# Patient Record
Sex: Male | Born: 1965 | Race: White | Hispanic: No | Marital: Single | Smoking: Never smoker
Health system: Southern US, Community
[De-identification: ages and names within clinical notes are randomized; demographics above are authoritative.]

---

## 2007-01-03 ENCOUNTER — Emergency Department (HOSPITAL_COMMUNITY): Admission: EM | Admit: 2007-01-03 | Discharge: 2007-01-03 | Payer: Self-pay | Admitting: Emergency Medicine

## 2019-10-20 DIAGNOSIS — S42302A Unspecified fracture of shaft of humerus, left arm, initial encounter for closed fracture: Secondary | ICD-10-CM

## 2019-10-20 HISTORY — DX: Unspecified fracture of shaft of humerus, left arm, initial encounter for closed fracture: S42.302A

## 2019-11-13 ENCOUNTER — Encounter: Payer: Self-pay | Admitting: Emergency Medicine

## 2019-11-13 ENCOUNTER — Other Ambulatory Visit: Payer: Self-pay

## 2019-11-13 ENCOUNTER — Emergency Department: Payer: BC Managed Care – PPO

## 2019-11-13 ENCOUNTER — Emergency Department
Admission: EM | Admit: 2019-11-13 | Discharge: 2019-11-13 | Disposition: A | Discharge status: Home / Self Care | Payer: BC Managed Care – PPO | Attending: Student in an Organized Health Care Education/Training Program | Admitting: Student in an Organized Health Care Education/Training Program

## 2019-11-13 DIAGNOSIS — Y998 Other external cause status: Secondary | ICD-10-CM | POA: Insufficient documentation

## 2019-11-13 DIAGNOSIS — Y9311 Activity, swimming: Secondary | ICD-10-CM | POA: Diagnosis not present

## 2019-11-13 DIAGNOSIS — W01198A Fall on same level from slipping, tripping and stumbling with subsequent striking against other object, initial encounter: Secondary | ICD-10-CM | POA: Insufficient documentation

## 2019-11-13 DIAGNOSIS — S4992XA Unspecified injury of left shoulder and upper arm, initial encounter: Secondary | ICD-10-CM | POA: Diagnosis present

## 2019-11-13 DIAGNOSIS — S42352A Displaced comminuted fracture of shaft of humerus, left arm, initial encounter for closed fracture: Secondary | ICD-10-CM | POA: Diagnosis not present

## 2019-11-13 DIAGNOSIS — Y9234 Swimming pool (public) as the place of occurrence of the external cause: Secondary | ICD-10-CM | POA: Insufficient documentation

## 2019-11-13 DIAGNOSIS — F1410 Cocaine abuse, uncomplicated: Secondary | ICD-10-CM | POA: Diagnosis not present

## 2019-11-13 MED ORDER — OXYCODONE-ACETAMINOPHEN 5-325 MG PO TABS
1.0000 | ORAL_TABLET | Freq: Once | ORAL | Status: AC
  Administered 2019-11-13: 1 via ORAL
  Filled 2019-11-13: qty 1

## 2019-11-13 MED ORDER — OXYCODONE-ACETAMINOPHEN 5-325 MG PO TABS: 1.0000 | ORAL_TABLET | Freq: Four times a day (QID) | ORAL | 0 refills | Status: DC | PRN

## 2019-11-13 NOTE — ED Triage Notes (Signed)
Patient states that he slipped and fell by the pool. Patient with pain and swelling to left upper arm.
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
## 2019-11-14 NOTE — TOC Progression Note (Signed)
Transition of Care [facility]) - Progression Note    Patient Details  Name: Cullan Launer MRN: [id number] Date of Birth: [birth year]  Transition of [facility]) CM/SW Contact  Barrie Dunker, RN Phone Number: 11/14/2019, 8:27 AM  Clinical Narrative:   Emailed a referral to [facility]          Expected Discharge Plan and Services                                                 Social Determinants of Health (SDOH) Interventions    Readmission Risk Interventions No flowsheet data found.

## 2019-11-18 HISTORY — PX: ORIF HUMERAL SHAFT FRACTURE: SUR937

## 2019-12-21 HISTORY — PX: INCISION AND DRAINAGE: SHX5863

## 2020-01-04 ENCOUNTER — Telehealth: Payer: Self-pay

## 2020-01-04 NOTE — Telephone Encounter (Signed)
COVID-19 Pre-Screening Questions:01/04/20 ° °Do you currently have a fever (>100 °F), chills or unexplained body aches? NO ° °Are you currently experiencing new cough, shortness of breath, sore throat, runny nose? NO °•  °Have you been in contact with someone that is currently pending confirmation of Covid19 testing or has been confirmed to have the Covid19 virus? NO ° °**If the patient answers NO to ALL questions -  advise the patient to please call the clinic before coming to the office should any symptoms develop.  ° ° °

## 2020-01-05 ENCOUNTER — Other Ambulatory Visit: Payer: Self-pay

## 2020-01-05 ENCOUNTER — Ambulatory Visit (INDEPENDENT_AMBULATORY_CARE_PROVIDER_SITE_OTHER): Payer: BC Managed Care – PPO | Admitting: Internal Medicine

## 2020-01-05 ENCOUNTER — Encounter: Payer: Self-pay | Admitting: Internal Medicine

## 2020-01-05 VITALS — Ht 73.0 in | Wt 201.0 lb

## 2020-01-05 DIAGNOSIS — S42302A Unspecified fracture of shaft of humerus, left arm, initial encounter for closed fracture: Secondary | ICD-10-CM

## 2020-01-05 DIAGNOSIS — T847XXA Infection and inflammatory reaction due to other internal orthopedic prosthetic devices, implants and grafts, initial encounter: Secondary | ICD-10-CM | POA: Diagnosis not present

## 2020-01-05 DIAGNOSIS — Z5181 Encounter for therapeutic drug level monitoring: Secondary | ICD-10-CM | POA: Diagnosis not present
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
## 2020-01-05 NOTE — Patient Instructions (Signed)
Thank you for coming to see me today. It was a pleasure.   Today we talked about:  -- we are continuing your antibiotics of vancomycin and ceftriaxone at the current dose.  I'll talk to your surgeon regarding a plan for discontinuing and we will come up with a plan.   Please follow-up with me in about 3-4 weeks.  If you have any questions or concerns, please do not hesitate to call the office at [phone].  Take Care,   Gwynn Burly, DO
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
## 2020-01-06 ENCOUNTER — Telehealth: Payer: Self-pay

## 2020-01-06 NOTE — Telephone Encounter (Signed)
[facility] returning [facility] phone call. Wanted to make her aware that picc line was initially out 4-5cm and this was not new development. Routing to [place] to make her aware. Valarie Cones

## 2020-01-06 NOTE — Progress Notes (Signed)
Patient uses [facility] for home infusion pharmacy and home health [phone]).  Per Olena Leatherwood, pharmacist, patient currently on ceftriaxone 2 gm daily, vancomycin 1750mg   Q12 (dosed at 8am and 8pm) with end date of 01/06/20 per his surgeon.   Relayed verbal order to [facility], pharmacist at [facility], per Dr [facility] to extend IV antibiotics for a full 6 week treatment, end date of 02/03/20, with twice weekly Vanc trough and CMP.  Labs should be faxed to [phone]. Next lab date is Tuesday 01/10/20. 01/12/20, RN

## 2020-01-06 NOTE — Telephone Encounter (Signed)
Thank you.  PICC was out 5cm at office visit, matching their report. Andree Coss, RN

## 2020-01-10 ENCOUNTER — Telehealth: Payer: Self-pay

## 2020-01-10 NOTE — Telephone Encounter (Signed)
Yes, I would recommend increasing dose. The pharmacist taking care of this should do this. We are not dosing. Thank you!

## 2020-01-10 NOTE — Telephone Encounter (Signed)
Pharmacist with Home health pharmacy called to notify team that vanc trough was low at 9.8; suggesting increase in vanc dose. Forwarding to provider and pharmacist. Lab work faxed to office this am.  Rosanna Randy, RN

## 2020-01-10 NOTE — Telephone Encounter (Signed)
Returned call to pharmacist with Home health team. Will fax new orders for review/provider signature with changes.   Leeba Barbe Loyola Mast, RN

## 2020-01-17 ENCOUNTER — Telehealth: Payer: Self-pay

## 2020-01-17 NOTE — Telephone Encounter (Signed)
Received call today from pharmacist with [facility] stating patient is Neutropenic. States absolute neutro count is 1.1, WBC: 2.8, Vanc trough and CMP pending.  Patient is currently on Vancomycin and Rocephin. Pharmacy would like call back with orders. P: [phone] Lorenso Courier, CMA

## 2020-01-17 NOTE — Telephone Encounter (Addendum)
Discussed with Dr. Juleen China. Called [facility] and spoke to pharmacist, [place]. Gave verbal orders to stop vancomycin and start daptomycin 8 mg/kg - 730 mg IV once daily. Also gave orders to continue ceftriaxone and lab orders but to d/c vanc troughs and add weekly CPK. This will be patient's first dose, so a nurse will go out to the patient's home with an anaphylaxis kit tomorrow morning to ensure patient tolerates dose. Sandy verbalized understanding. She will fax orders here for Dr. Juleen China to sign for their records.

## 2020-01-17 NOTE — Telephone Encounter (Signed)
Received call from wife, patient is currently at surgeon's office. Wife stating Dr. Stephenie Acres is requesting patient to be seen today or tomorrow regarding increased wound drainage. Wife  Gave phone to RN. She denies any wound odor, denies fevers. Made nurse aware that [facility] does not have any available opening today. Wife accepts sooner appointment on 01/23/20. Per nurse Dr. Stephenie Acres has Dr. Philis Pique cell phone number and will consult with Dr. Earlene Plater. Routing message to make Dr. Earlene Plater aware.  Valarie Cones

## 2020-01-18 ENCOUNTER — Telehealth: Payer: Self-pay | Admitting: Internal Medicine

## 2020-01-18 NOTE — Telephone Encounter (Signed)
Patient called this morning to update on [facility] labs and discuss changing vancomycin to daptomycin while also continuing ceftriaxone.  Pt reports home health RN had just been at the house to administer first dose and it went fine.  He reports some increased drainage from wound that his surgeon is aware of and is awaiting CT scan order for further evaluation.  Follow up with me confirmed for 01/23/20.  Pt otherwise states doing well without fevers, chills, worsening pain.    [facility] 01/18/2020, 11:28 AM

## 2020-01-20 ENCOUNTER — Other Ambulatory Visit: Payer: Self-pay

## 2020-01-20 ENCOUNTER — Other Ambulatory Visit: Payer: Self-pay | Admitting: Orthopaedic Surgery

## 2020-01-20 ENCOUNTER — Ambulatory Visit
Admission: RE | Admit: 2020-01-20 | Discharge: 2020-01-20 | Disposition: A | Discharge status: Home / Self Care | Payer: BC Managed Care – PPO | Source: Ambulatory Visit | Attending: Orthopaedic Surgery | Admitting: Orthopaedic Surgery

## 2020-01-20 DIAGNOSIS — G8918 Other acute postprocedural pain: Secondary | ICD-10-CM

## 2020-01-20 DIAGNOSIS — S42332D Displaced oblique fracture of shaft of humerus, left arm, subsequent encounter for fracture with routine healing: Secondary | ICD-10-CM

## 2020-01-23 ENCOUNTER — Encounter: Payer: Self-pay | Admitting: Internal Medicine

## 2020-01-23 ENCOUNTER — Other Ambulatory Visit: Payer: Self-pay

## 2020-01-23 ENCOUNTER — Ambulatory Visit (INDEPENDENT_AMBULATORY_CARE_PROVIDER_SITE_OTHER): Payer: BC Managed Care – PPO | Admitting: Internal Medicine

## 2020-01-23 DIAGNOSIS — S42302A Unspecified fracture of shaft of humerus, left arm, initial encounter for closed fracture: Secondary | ICD-10-CM

## 2020-01-23 DIAGNOSIS — T847XXA Infection and inflammatory reaction due to other internal orthopedic prosthetic devices, implants and grafts, initial encounter: Secondary | ICD-10-CM | POA: Diagnosis not present

## 2020-01-23 NOTE — Progress Notes (Addendum)
[facility]  Chief Complaint:  Follow up of wound infection  Subjective: Danny Roberts is a 54 y.o. male with PMHx as below who presents to the clinic for follow up of his wound infection. Please see A&P for the details of today's visit and status of the patient's medical problems.   Past Medical History:  Diagnosis Date   Fracture of humeral shaft, left, closed 10/2019    Outpatient Encounter Medications as of 01/23/2020  Medication Sig   diclofenac (CATAFLAM) 50 MG tablet diclofenac potassium 50 mg tablet  Take 1 tablet 3 times a day by oral route.  with food   No facility-administered encounter medications on file as of 01/23/2020.    Family History  Problem Relation Age of Onset   Heart disease Father    Diabetes Brother    Heart disease Brother    Tuberculosis Neg Hx     Social History   Socioeconomic History   Marital status: Single    Spouse name: Not on file   Number of children: Not on file   Years of education: Not on file   Highest education level: Not on file  Occupational History   Not on file  Tobacco Use   Smoking status: Never Smoker   Smokeless tobacco: Never Used  Substance and Sexual Activity   Alcohol use: Yes   Drug use: Not Currently    Types: Cocaine   Sexual activity: Not on file  Other Topics Concern   Not on file  Social History Narrative   Not on file   Social Determinants of Health   Financial Resource Strain:    Difficulty of Paying Living Expenses: Not on file  Food Insecurity:    Worried About Running Out of Food in the Last Year: Not on file   Ran Out of Food in the Last Year: Not on file  Transportation Needs:    Lack of Transportation (Medical): Not on file   Lack of Transportation (Non-Medical): Not on file  Physical Activity:    Days of Exercise per Week: Not on file   Minutes of Exercise per Session: Not on file  Stress:    Feeling of Stress : Not  on file  Social Connections:    Frequency of Communication with Friends and Family: Not on file   Frequency of Social Gatherings with Friends and Family: Not on file   Attends Religious Services: Not on file   Active Member of Clubs or Organizations: Not on file   Attends Banker Meetings: Not on file   Marital Status: Not on file  Intimate Partner Violence:    Fear of Current or Ex-Partner: Not on file   Emotionally Abused: Not on file   Physically Abused: Not on file   Sexually Abused: Not on file    No Known Allergies   Review of Systems: Review of Systems  Constitutional: Negative for chills and fever.  Respiratory: Negative.   Cardiovascular: Negative.   Gastrointestinal: Negative.   Musculoskeletal: Positive for joint pain.  Skin: Negative.      All others negative.  Objective: Vitals:   01/23/20 1123  Pulse: 74  Temp: 97.8 F (36.6 C)  SpO2: 100%  Weight: 201 lb (91.2 kg)   Body mass index is 26.52 kg/m.  Physical Exam Constitutional:      General: He is not in acute distress.    Appearance: Normal appearance.  HENT:  Head: Normocephalic and atraumatic.  Musculoskeletal:        General: Deformity and signs of injury present. No tenderness.     Comments: See picture.  He was able to express some sanguinous fluid from the upper   Skin:    General: Skin is warm and dry.  Neurological:     Mental Status: He is alert.        Imaging: IMPRESSION: Status post fixation of a diaphyseal fracture of the left humerus. There is callus formation but no bridging bone is identified.  Evaluation for osteomyelitis is limited on CT scan, particularly in the setting of recent fracture fixation, but no convincing evidence of osteomyelitis is seen.  Stranding in the posterior subcutaneous tissues of the upper arm could be due to cellulitis and/or postoperative change. No abscess is identified.    Assessment and  Plan:  Hardware complicating wound infection (HCC) Status post left humeral shaft fracture with ORIF 11/18/2019 complicated by wound infection now status post irrigation and debridement of wound and surgical site on 12/21/2019.  I saw him on 01/05/20 and he was doing well post-operatively with vancomycin and ceftriaxone.  His cultures were negative and abx were continued.  I discussed with his surgeon, Dr Stephenie Acres, who noted that infection did track to the hardware but that muscle and fascia were intact.   My intention is to treat as an osteofixation infection for 6 weeks of IV antibiotics.  Last week his WBC dropped and this was attributed to vancomycin.  He was switched to daptomycin and continued on ceftriaxone.  He saw his surgeon last week who was concerned that he may need another debridement based on increased drainage.  A CT was obtained to evaluate fracture healing.  The images are not available but the report noted that there was callus formation but no bridging bone identified.  No evidence of OM was seen.  There was some stranding in the posterior subcutaneous tissue that could be cellulitis and/or postoperative change.  No abscess was seen.   Plan: --Continue ceftriaxone and daptomycin for now with weekly lab monitoring --Will discuss with surgeon regarding CT findings --RTC 02/08/20     Vedia Coffer for Infectious Disease [facility] 01/31/2020, 11:11 AM

## 2020-01-23 NOTE — Assessment & Plan Note (Addendum)
Status post left humeral shaft fracture with ORIF 11/18/2019 complicated by wound infection now status post irrigation and debridement of wound and surgical site on 12/21/2019.  I saw him on 01/05/20 and he was doing well post-operatively with vancomycin and ceftriaxone.  His cultures were negative and abx were continued.  I discussed with his surgeon, Dr Stephenie Acres, who noted that infection did track to the hardware but that muscle and fascia were intact.   My intention is to treat as an osteofixation infection for 6 weeks of IV antibiotics.  Last week his WBC dropped and this was attributed to vancomycin.  He was switched to daptomycin and continued on ceftriaxone.  He saw his surgeon last week who was concerned that he may need another debridement based on increased drainage.  A CT was obtained to evaluate fracture healing.  The images are not available but the report noted that there was callus formation but no bridging bone identified.  No evidence of OM was seen.  There was some stranding in the posterior subcutaneous tissue that could be cellulitis and/or postoperative change.  No abscess was seen.   Plan: --Continue ceftriaxone and daptomycin for now with weekly lab monitoring --Will discuss with surgeon regarding CT findings --RTC 02/08/20

## 2020-01-23 NOTE — Patient Instructions (Signed)
Thank you for coming to see me today. It was a pleasure.   Today we talked about:  -- I will touch base with Dr Stephenie Acres about next steps    Please follow-up with on October 20 in a couple weeks.  If you have any questions or concerns, please do not hesitate to call the office at [phone].  Take Care,   Gwynn Burly, DO

## 2020-01-25 ENCOUNTER — Ambulatory Visit: Payer: BC Managed Care – PPO | Admitting: Internal Medicine

## 2020-01-27 ENCOUNTER — Telehealth: Payer: Self-pay | Admitting: *Deleted

## 2020-01-27 NOTE — Telephone Encounter (Signed)
Per Dr Juleen China, requested last 2 weeks of labs to be faxed to [phone]; ESR, CRP added for Monday's lab draw. Landis Gandy, RN

## 2020-02-01 ENCOUNTER — Telehealth: Payer: Self-pay | Admitting: *Deleted

## 2020-02-01 NOTE — Telephone Encounter (Signed)
Per verbal order from Dr Earlene Plater, called outpatient pharmacy with new end date of 02/10/20 for IV antibiotics, labs, PICC care. Patient follows up in clinic 10/20. Andree Coss, RN

## 2020-02-08 ENCOUNTER — Other Ambulatory Visit: Payer: Self-pay

## 2020-02-08 ENCOUNTER — Ambulatory Visit (INDEPENDENT_AMBULATORY_CARE_PROVIDER_SITE_OTHER): Payer: BC Managed Care – PPO | Admitting: Internal Medicine

## 2020-02-08 ENCOUNTER — Encounter: Payer: Self-pay | Admitting: Internal Medicine

## 2020-02-08 ENCOUNTER — Telehealth: Payer: Self-pay

## 2020-02-08 VITALS — BP 136/101 | HR 78 | Temp 97.7°F | Ht 73.0 in | Wt 200.0 lb

## 2020-02-08 DIAGNOSIS — T847XXA Infection and inflammatory reaction due to other internal orthopedic prosthetic devices, implants and grafts, initial encounter: Secondary | ICD-10-CM

## 2020-02-08 MED ORDER — SULFAMETHOXAZOLE-TRIMETHOPRIM 800-160 MG PO TABS: 1.0000 | ORAL_TABLET | Freq: Two times a day (BID) | ORAL | 0 refills | Status: DC

## 2020-02-08 NOTE — Assessment & Plan Note (Addendum)
Status post left humeral shaft fracture with ORIF 07/10/2246 complicated by wound infection now status post irrigation and debridement of wound and surgical site on 12/21/2019.    Patient was seen by his orthopedic surgeon on 10/12.  His wound looked better that visit and seemed to have been responding well to the daptomycin and ceftriaxone combination.  He has now been on antibiotics intravenously since his surgery on 12/21/19.  I discussed with his surgeon who sees him back next week for re-check.  Plan is to repeat x-ray soon and then repeat CT 6 weeks from prior scan (~03/02/20).  Vanco 9/1 --> 9/28 Dapto 9/29 --> pres CTX 9/1 --> pres  Plan: -- continue IV antibiotics through end date of 02/10/20 which will have been 7 weeks of IV antibiotics.  Pull PICC at end of treatment -- at that point will transition him to an oral suppressive regimen until he has repeat CT scan in a few weeks.  At that point if there is non-union then this could be a sign of ongoing infection and need for plate removal/re-debridement. -- will prescribe TMP-SMX 1 DS tablet BID for suppression -- repeat CBC, BMP, ESR/CRP today -- RTC 2 weeks

## 2020-02-08 NOTE — Progress Notes (Addendum)
[facility]  Chief Complaint:  Follow up for hardware complicating wound infection  Subjective: Danny Roberts is a 54 y.o. male with PMHx as below who presents to the clinic for follow of hardware complicating wound infection. Please see A&P for the details of today's visit and status of the patient's medical problems.   Past Medical History:  Diagnosis Date  . Fracture of humeral shaft, left, closed 10/2019    Outpatient Encounter Medications as of 02/08/2020  Medication Sig  . cyclobenzaprine (FLEXERIL) 10 MG tablet Take 10 mg by mouth 3 (three) times daily.  . diclofenac (CATAFLAM) 50 MG tablet diclofenac potassium 50 mg tablet  Take 1 tablet 3 times a day by oral route.  with food  . diclofenac Sodium (VOLTAREN) 1 % GEL Apply 1 application topically 4 (four) times daily.  Marland Kitchen sulfamethoxazole-trimethoprim (BACTRIM DS) 800-160 MG tablet Take 1 tablet by mouth 2 (two) times daily.   No facility-administered encounter medications on file as of 02/08/2020.    Family History  Problem Relation Age of Onset  . Heart disease Father   . Diabetes Brother   . Heart disease Brother   . Tuberculosis Neg Hx     Social History   Socioeconomic History  . Marital status: Single    Spouse name: Not on file  . Number of children: Not on file  . Years of education: Not on file  . Highest education level: Not on file  Occupational History  . Not on file  Tobacco Use  . Smoking status: Never Smoker  . Smokeless tobacco: Never Used  Substance and Sexual Activity  . Alcohol use: Yes    Comment: occ beer  . Drug use: Not Currently    Types: Cocaine  . Sexual activity: Not on file  Other Topics Concern  . Not on file  Social History Narrative  . Not on file   Social Determinants of Health   Financial Resource Strain:   . Difficulty of Paying Living Expenses: Not on file  Food Insecurity:   . Worried About Charity fundraiser in the Last  Year: Not on file  . Ran Out of Food in the Last Year: Not on file  Transportation Needs:   . Lack of Transportation (Medical): Not on file  . Lack of Transportation (Non-Medical): Not on file  Physical Activity:   . Days of Exercise per Week: Not on file  . Minutes of Exercise per Session: Not on file  Stress:   . Feeling of Stress : Not on file  Social Connections:   . Frequency of Communication with Friends and Family: Not on file  . Frequency of Social Gatherings with Friends and Family: Not on file  . Attends Religious Services: Not on file  . Active Member of Clubs or Organizations: Not on file  . Attends Archivist Meetings: Not on file  . Marital Status: Not on file  Intimate Partner Violence:   . Fear of Current or Ex-Partner: Not on file  . Emotionally Abused: Not on file  . Physically Abused: Not on file  . Sexually Abused: Not on file    No Known Allergies   Review of Systems: Review of Systems  Constitutional: Negative for chills and fever.  Gastrointestinal: Negative for diarrhea.  Musculoskeletal:       Improved swelling and drainage.      All others negative.  Objective: Vitals:   02/08/20 1039  BP: (!) 136/101  Pulse: 78  Temp: 97.7 F (36.5 C)  TempSrc: Oral  SpO2: 99%  Weight: 200 lb (90.7 kg)  Height: _0  (1.854 m)   Body mass index is 26.39 kg/m.  Physical Exam Vitals reviewed.  Constitutional:      General: He is not in acute distress.    Appearance: Normal appearance.  Pulmonary:     Effort: Pulmonary effort is normal. No respiratory distress.  Musculoskeletal:     Comments: See picture in chart.   Neurological:     Mental Status: He is alert.        Assessment and Plan:  Hardware complicating wound infection ([place]) Status post left humeral shaft fracture with ORIF 3/53/2992 complicated by wound infection now status post irrigation and debridement of wound and surgical site on 12/21/2019.    Patient was seen by  his orthopedic surgeon on 10/12.  His wound looked better that visit and seemed to have been responding well to the daptomycin and ceftriaxone combination.  He has now been on antibiotics intravenously since his surgery on 12/21/19.  I discussed with his surgeon who sees him back next week for re-check.  Plan is to repeat x-ray soon and then repeat CT 6 weeks from prior scan (~03/02/20).  Vanco 9/1 --> 9/28 Dapto 9/29 --> pres CTX 9/1 --> pres  Plan: -- continue IV antibiotics through end date of 02/10/20 which will have been 7 weeks of IV antibiotics.  Pull PICC at end of treatment -- at that point will transition him to an oral suppressive regimen until he has repeat CT scan in a few weeks.  At that point if there is non-union then this could be a sign of ongoing infection and need for plate removal/re-debridement. -- will prescribe TMP-SMX 1 DS tablet BID for suppression -- repeat CBC, BMP, ESR/CRP today -- RTC 2 weeks    Orders Placed This Encounter  Procedures  . CBC  . BASIC METABOLIC PANEL WITH GFR  . Sedimentation rate  . C-reactive protein     I spent 52 minutes with the patient including greater than 50% of time in face to face counsel of the patient and in coordination of their care.   Raynelle Highland for Infectious Disease [facility] 02/08/2020, 11:14 AM

## 2020-02-08 NOTE — Patient Instructions (Signed)
Thank you for coming to see me today. It was a pleasure.   Today we talked about:  -- we'll finish the IV antibiotics this Friday which will be a little over 7 weeks of treatment -- we then will start an oral suppressive antibiotic called Bactrim for a few weeks at least until you have your repeat CT scan -- I will check labs today and plan to see you back in about 2 weeks  If you have any questions or concerns, please do not hesitate to call the office at [phone].  Take Care,   Gwynn Burly, DO

## 2020-02-08 NOTE — Telephone Encounter (Signed)
Spoke with Olena Leatherwood at [facility] to relay verbal orders per Dr. Earlene Plater that okay to pull PICC after last antibiotic infusion on 02/10/20. Orders repeated and verified.   Sandie Ano, RN

## 2020-02-09 ENCOUNTER — Telehealth: Payer: Self-pay

## 2020-02-09 LAB — CBC
HCT: 44.5 % (ref 38.5–50.0)
Hemoglobin: 14.8 g/dL (ref 13.2–17.1)
MCH: 30 pg (ref 27.0–33.0)
MCHC: 33.3 g/dL (ref 32.0–36.0)
MCV: 90.3 fL (ref 80.0–100.0)
MPV: 10.9 fL (ref 7.5–12.5)
Platelets: 226 10*3/uL (ref 140–400)
RBC: 4.93 10*6/uL (ref 4.20–5.80)
RDW: 13.4 % (ref 11.0–15.0)
WBC: 4.5 10*3/uL (ref 3.8–10.8)

## 2020-02-09 LAB — BASIC METABOLIC PANEL WITH GFR
BUN: 22 mg/dL (ref 7–25)
CO2: 27 mmol/L (ref 20–32)
Calcium: 9.5 mg/dL (ref 8.6–10.3)
Chloride: 103 mmol/L (ref 98–110)
Creat: 0.85 mg/dL (ref 0.70–1.33)
GFR, Est African American: 114 mL/min/{1.73_m2} (ref >=60)
GFR, Est Non African American: 99 mL/min/{1.73_m2} (ref >=60)
Glucose, Bld: 87 mg/dL (ref 65–99)
Potassium: 4.4 mmol/L (ref 3.5–5.3)
Sodium: 137 mmol/L (ref 135–146)

## 2020-02-09 LAB — C-REACTIVE PROTEIN: CRP: 9.1 mg/L — ABNORMAL HIGH (ref <8.0)

## 2020-02-09 LAB — SEDIMENTATION RATE: Sed Rate: 17 mm/h (ref 0–20)

## 2020-02-09 NOTE — Telephone Encounter (Signed)
Left VM requesting call back to review lab results and treatment plan.  Alaiah Lundy Loyola Mast, RN

## 2020-02-09 NOTE — Telephone Encounter (Signed)
-----   Message from Kathlynn Grate, DO sent at 02/09/2020  1:55 PM EDT ----- Can you let pt know his labs from yesterday look reassuring.  Continue current plan of IV antibiotics through Friday then start Bactrim as prescribed.  I will see him back in 2 weeks. Thanks.

## 2020-02-09 NOTE — Telephone Encounter (Signed)
Patient returned the call. RN relayed results. Patient verbalized understanding. Andree Coss, RN

## 2020-02-16 ENCOUNTER — Other Ambulatory Visit: Payer: Self-pay | Admitting: Orthopaedic Surgery

## 2020-02-16 ENCOUNTER — Other Ambulatory Visit (HOSPITAL_COMMUNITY): Payer: Self-pay | Admitting: Orthopaedic Surgery

## 2020-02-16 DIAGNOSIS — Z9889 Other specified postprocedural states: Secondary | ICD-10-CM

## 2020-02-22 ENCOUNTER — Ambulatory Visit
Admission: RE | Admit: 2020-02-22 | Discharge: 2020-02-22 | Disposition: A | Discharge status: Home / Self Care | Payer: BC Managed Care – PPO | Source: Ambulatory Visit | Attending: Orthopaedic Surgery | Admitting: Orthopaedic Surgery

## 2020-02-22 ENCOUNTER — Other Ambulatory Visit: Payer: Self-pay

## 2020-02-22 DIAGNOSIS — Z9889 Other specified postprocedural states: Secondary | ICD-10-CM | POA: Diagnosis not present

## 2020-02-23 NOTE — Progress Notes (Signed)
[facility]  Chief Complaint:  Follow up for hardware complicating wound infection  Subjective: Danny Roberts is a 54 y.o. male with PMHx as below who presents to the clinic for follow up hardware complicating wound infection.  Last seen in clinic on 02/08/20. Please see A&P for the details of today's visit and status of the patient's medical problems.   Past Medical History:  Diagnosis Date  . Fracture of humeral shaft, left, closed 10/2019    Outpatient Encounter Medications as of 02/24/2020  Medication Sig  . cyclobenzaprine (FLEXERIL) 10 MG tablet Take 10 mg by mouth 3 (three) times daily.  . diclofenac (CATAFLAM) 50 MG tablet diclofenac potassium 50 mg tablet  Take 1 tablet 3 times a day by oral route.  with food  . sulfamethoxazole-trimethoprim (BACTRIM DS) 800-160 MG tablet Take 1 tablet by mouth 2 (two) times daily.  . [DISCONTINUED] diclofenac Sodium (VOLTAREN) 1 % GEL Apply 1 application topically 4 (four) times daily.   No facility-administered encounter medications on file as of 02/24/2020.    Family History  Problem Relation Age of Onset  . Heart disease Father   . Diabetes Brother   . Heart disease Brother   . Tuberculosis Neg Hx     Social History   Socioeconomic History  . Marital status: Single    Spouse name: Not on file  . Number of children: Not on file  . Years of education: Not on file  . Highest education level: Not on file  Occupational History  . Not on file  Tobacco Use  . Smoking status: Never Smoker  . Smokeless tobacco: Never Used  Substance and Sexual Activity  . Alcohol use: Yes    Comment: occ beer  . Drug use: Not Currently    Types: Cocaine  . Sexual activity: Not on file  Other Topics Concern  . Not on file  Social History Narrative  . Not on file   Social Determinants of Health   Financial Resource Strain:   . Difficulty of Paying Living Expenses: Not on file  Food Insecurity:     . Worried About Charity fundraiser in the Last Year: Not on file  . Ran Out of Food in the Last Year: Not on file  Transportation Needs:   . Lack of Transportation (Medical): Not on file  . Lack of Transportation (Non-Medical): Not on file  Physical Activity:   . Days of Exercise per Week: Not on file  . Minutes of Exercise per Session: Not on file  Stress:   . Feeling of Stress : Not on file  Social Connections:   . Frequency of Communication with Friends and Family: Not on file  . Frequency of Social Gatherings with Friends and Family: Not on file  . Attends Religious Services: Not on file  . Active Member of Clubs or Organizations: Not on file  . Attends Archivist Meetings: Not on file  . Marital Status: Not on file  Intimate Partner Violence:   . Fear of Current or Ex-Partner: Not on file  . Emotionally Abused: Not on file  . Physically Abused: Not on file  . Sexually Abused: Not on file    No Known Allergies   Review of Systems: Review of Systems  Constitutional: Negative for chills and fever.  Musculoskeletal: Negative for joint pain.       Continued drainage  Skin: Negative for itching and rash.  Objective: Vitals:   02/24/20 1037  BP: 125/71  Pulse: 76  Temp: 97.6 F (36.4 C)  Weight: 207 lb (93.9 kg)   Body mass index is 27.31 kg/m.  Physical Exam Constitutional:      General: He is not in acute distress.    Appearance: Normal appearance.  HENT:     Head: Normocephalic and atraumatic.  Musculoskeletal:     Comments: Right picc line now removed. Left arm with mild drainage of serosanguinous fluid able to be expressed from distal area of wound.  Has good ROM with elbow flexion/extension.  Neurological:     Mental Status: He is alert.       Pertinent Labs and Microbiology CBC Latest Ref Rng & Units 02/08/2020  WBC 3.8 - 10.8 Thousand/uL 4.5  Hemoglobin 13.2 - 17.1 g/dL 14.8  Hematocrit 38 - 50 % 44.5  Platelets 140 - 400  Thousand/uL 226   CMP Latest Ref Rng & Units 02/08/2020  Glucose 65 - 99 mg/dL 87  BUN 7 - 25 mg/dL 22  Creatinine 0.70 - 1.33 mg/dL 0.85  Sodium 135 - 146 mmol/L 137  Potassium 3.5 - 5.3 mmol/L 4.4  Chloride 98 - 110 mmol/L 103  CO2 20 - 32 mmol/L 27  Calcium 8.6 - 10.3 mg/dL 9.5    C-Reactive Protein     Component Value Date/Time   CRP 9.1 (H) 02/08/2020 1134   Erythrocyte Sedimentation Rate     Component Value Date/Time   ESRSEDRATE 17 02/08/2020 1134    Imaging CT Left upper extremity 02/22/20: IMPRESSION: 1. Overall, little change is seen from the prior study of 5 weeks ago. 2. Stable postsurgical changes status post posterior plate and screw fixation of the mid to distal left humerus without definite bridging bone across the fracture. 3. No evidence of bone destruction to suggest osteomyelitis. The overall density within the marrow space appears mildly increased in the interval, and this could be secondary to inflammation. 4. No evidence of focal fluid collection or soft tissue emphysema.  Assessment and Plan: Hardware complicating wound infection ([place]) Status post left humeral shaft fracture with ORIF 07/21/270 complicated by wound infection and status post irrigation and debridement of wound and surgical site on 12/21/2019.  Cultures from this were negative for bacterial, fungal, and AFB, however, he was on oral antibiotics prior to cultures being obtained.  He completed ~7 weeks of IV antibiotics as detailed below before transitioning to TMP-SMX 1 DS tablet BID on 02/11/20.  Continuing to have light drainage from surgical wound so repeat CT scan obtained 02/22/20 (about 5 weeks after prior) which showed no evidence of osteomyelitis or fluid collections, but there was no definite bridging bone across his fracture.  Vanco 9/1 --> 9/28 (stopped due to neutropenia) Dapto 9/29 --> 10/22 CTX 9/1 --> 10/22  TMP-SMX 10/23 --> present  Plan: -- continue TMP-SMX 1 DS tablet  BID for now pending follow up with surgery next week -- my concern is for an indolent infection given persistent drainage and fracture non-union despite several weeks of IV antibiotics and now on oral suppression -- if he does go back to the OR, would like to get tissue cultures and pathology, but also look into sending 16s testing to [place] or [place] -- monitor labs today with CBC, BMP, ESR, CRP -- will also culture drainage from wound today although will have to interpret with caution as may be skin flora contaminant -- RTC 2 weeks   Orders Placed This Encounter  Procedures  . Anaerobic and Aerobic Culture    Drainage from left arm wound.  Marland Kitchen CBC w/Diff  . BASIC METABOLIC PANEL WITH GFR  . Sedimentation rate  . C-reactive protein      Raynelle Highland for Infectious Disease [facility] 02/24/2020, 11:28 AM

## 2020-02-23 NOTE — Assessment & Plan Note (Addendum)
Status post left humeral shaft fracture with ORIF 7/61/6073 complicated by wound infection and status post irrigation and debridement of wound and surgical site on 12/21/2019.  Cultures from this were negative for bacterial, fungal, and AFB, however, he was on oral antibiotics prior to cultures being obtained.  He completed ~7 weeks of IV antibiotics as detailed below before transitioning to TMP-SMX 1 DS tablet BID on 02/11/20.  Continuing to have light drainage from surgical wound so repeat CT scan obtained 02/22/20 (about 5 weeks after prior) which showed no evidence of osteomyelitis or fluid collections, but there was no definite bridging bone across his fracture.  Vanco 9/1 --> 9/28 (stopped due to neutropenia) Dapto 9/29 --> 10/22 CTX 9/1 --> 10/22  TMP-SMX 10/23 --> present  Plan: -- continue TMP-SMX 1 DS tablet BID for now pending follow up with surgery next week -- my concern is for an indolent infection given persistent drainage and fracture non-union despite several weeks of IV antibiotics and now on oral suppression -- if he does go back to the OR, would like to get tissue cultures and pathology, but also look into sending 16s testing to [place] or [place] -- monitor labs today with CBC, BMP, ESR, CRP -- will also culture drainage from wound today although will have to interpret with caution as may be skin flora contaminant -- RTC 2 weeks

## 2020-02-24 ENCOUNTER — Ambulatory Visit: Payer: BC Managed Care – PPO | Admitting: Internal Medicine

## 2020-02-24 ENCOUNTER — Encounter: Payer: Self-pay | Admitting: Internal Medicine

## 2020-02-24 ENCOUNTER — Other Ambulatory Visit: Payer: Self-pay

## 2020-02-24 VITALS — BP 125/71 | HR 76 | Temp 97.6°F | Wt 207.0 lb

## 2020-02-24 DIAGNOSIS — T847XXD Infection and inflammatory reaction due to other internal orthopedic prosthetic devices, implants and grafts, subsequent encounter: Secondary | ICD-10-CM | POA: Diagnosis not present

## 2020-02-24 NOTE — Patient Instructions (Signed)
Thank you for coming to see me today. It was a pleasure seeing you.  Please keep taking the Bactrim for now until we figure out the next steps with your orthopedic surgeon.   If you have any questions or concerns, please do not hesitate to call the office at [phone].  Take Care,   Gwynn Burly, DO

## 2020-02-27 ENCOUNTER — Telehealth: Payer: Self-pay

## 2020-02-27 NOTE — Telephone Encounter (Signed)
-----   Message from Kathlynn Grate, DO sent at 02/27/2020  2:12 PM EST ----- Can you please let patient know that his labs looks okay and to continue the Bactrim as prescribed for now.  The cultures we obtained from his wound drainage currently are no growth preliminary result.  Thanks.

## 2020-02-27 NOTE — Telephone Encounter (Signed)
Spoke with the patient to let him know that per Dr. Earlene Plater his labs look okay, but that Dr. Earlene Plater would like for him to continue taking the Bactrim for now pending follow up. Patient verbalized understanding and has no further questions.   Sandie Ano, RN

## 2020-03-01 ENCOUNTER — Other Ambulatory Visit: Payer: Self-pay

## 2020-03-01 DIAGNOSIS — T847XXA Infection and inflammatory reaction due to other internal orthopedic prosthetic devices, implants and grafts, initial encounter: Secondary | ICD-10-CM

## 2020-03-01 MED ORDER — SULFAMETHOXAZOLE-TRIMETHOPRIM 800-160 MG PO TABS: 1.0000 | ORAL_TABLET | Freq: Two times a day (BID) | ORAL | 0 refills | Status: AC

## 2020-03-02 LAB — BASIC METABOLIC PANEL WITH GFR
BUN: 18 mg/dL (ref 7–25)
CO2: 31 mmol/L (ref 20–32)
Calcium: 9.5 mg/dL (ref 8.6–10.3)
Chloride: 101 mmol/L (ref 98–110)
Creat: 1.23 mg/dL (ref 0.70–1.33)
GFR, Est African American: 77 mL/min/{1.73_m2} (ref >=60)
GFR, Est Non African American: 66 mL/min/{1.73_m2} (ref >=60)
Glucose, Bld: 83 mg/dL (ref 65–99)
Potassium: 4.6 mmol/L (ref 3.5–5.3)
Sodium: 136 mmol/L (ref 135–146)

## 2020-03-02 LAB — CBC WITH DIFFERENTIAL/PLATELET
Absolute Monocytes: 512 cells/uL (ref 200–950)
Basophils Absolute: 32 cells/uL (ref 0–200)
Basophils Relative: 0.8 %
Eosinophils Absolute: 152 cells/uL (ref 15–500)
Eosinophils Relative: 3.8 %
HCT: 42.5 % (ref 38.5–50.0)
Hemoglobin: 14.9 g/dL (ref 13.2–17.1)
Lymphs Abs: 884 cells/uL (ref 850–3900)
MCH: 31.4 pg (ref 27.0–33.0)
MCHC: 35.1 g/dL (ref 32.0–36.0)
MCV: 89.7 fL (ref 80.0–100.0)
MPV: 10.7 fL (ref 7.5–12.5)
Monocytes Relative: 12.8 %
Neutro Abs: 2420 cells/uL (ref 1500–7800)
Neutrophils Relative %: 60.5 %
Platelets: 198 10*3/uL (ref 140–400)
RBC: 4.74 10*6/uL (ref 4.20–5.80)
RDW: 13.8 % (ref 11.0–15.0)
Total Lymphocyte: 22.1 %
WBC: 4 10*3/uL (ref 3.8–10.8)

## 2020-03-02 LAB — ANAEROBIC AND AEROBIC CULTURE
AER RESULT:: NO GROWTH
MICRO NUMBER:: 11172144
MICRO NUMBER:: 11172145
SPECIMEN QUALITY:: ADEQUATE
SPECIMEN QUALITY:: ADEQUATE

## 2020-03-02 LAB — SEDIMENTATION RATE: Sed Rate: 11 mm/h (ref 0–20)

## 2020-03-02 LAB — C-REACTIVE PROTEIN: CRP: 5.5 mg/L (ref <8.0)

## 2020-03-08 NOTE — Progress Notes (Signed)
[facility]  Chief Complaint:  Follow up for hardware complicating wound infection  Subjective: Danny Roberts is a 54 y.o. male with PMHx as below who presents to the clinic for follow up of hardware complicating wound infection.   Last seen in ID clinic on 02/24/20.  Seen by his orthopedic surgeon on 02/28/20  Please see A&P for the details of today's visit and status of the patient's medical problems.   Patient's Medications  New Prescriptions   No medications on file  Previous Medications   CYCLOBENZAPRINE (FLEXERIL) 10 MG TABLET    Take 10 mg by mouth 3 (three) times daily.   DICLOFENAC (CATAFLAM) 50 MG TABLET    diclofenac potassium 50 mg tablet  Take 1 tablet 3 times a day by oral route.  with food   SULFAMETHOXAZOLE-TRIMETHOPRIM (BACTRIM DS) 800-160 MG TABLET    Take 1 tablet by mouth 2 (two) times daily.  Modified Medications   No medications on file  Discontinued Medications   No medications on file     Past Medical History:  Diagnosis Date  . Fracture of humeral shaft, left, closed 10/2019    Family History  Problem Relation Age of Onset  . Heart disease Father   . Diabetes Brother   . Heart disease Brother   . Tuberculosis Neg Hx     Social History   Socioeconomic History  . Marital status: Single    Spouse name: Not on file  . Number of children: Not on file  . Years of education: Not on file  . Highest education level: Not on file  Occupational History  . Not on file  Tobacco Use  . Smoking status: Never Smoker  . Smokeless tobacco: Never Used  Substance and Sexual Activity  . Alcohol use: Yes    Comment: occ beer  . Drug use: Not Currently    Types: Cocaine  . Sexual activity: Not on file  Other Topics Concern  . Not on file  Social History Narrative  . Not on file   Social Determinants of Health   Financial Resource Strain:   . Difficulty of Paying Living Expenses: Not on file  Food Insecurity:    . Worried About Charity fundraiser in the Last Year: Not on file  . Ran Out of Food in the Last Year: Not on file  Transportation Needs:   . Lack of Transportation (Medical): Not on file  . Lack of Transportation (Non-Medical): Not on file  Physical Activity:   . Days of Exercise per Week: Not on file  . Minutes of Exercise per Session: Not on file  Stress:   . Feeling of Stress : Not on file  Social Connections:   . Frequency of Communication with Friends and Family: Not on file  . Frequency of Social Gatherings with Friends and Family: Not on file  . Attends Religious Services: Not on file  . Active Member of Clubs or Organizations: Not on file  . Attends Archivist Meetings: Not on file  . Marital Status: Not on file  Intimate Partner Violence:   . Fear of Current or Ex-Partner: Not on file  . Emotionally Abused: Not on file  . Physically Abused: Not on file  . Sexually Abused: Not on file    No Known Allergies   Review of Systems: Review of Systems  Constitutional: Negative for chills and fever.  Respiratory: Negative.   Cardiovascular: Negative.  Gastrointestinal: Negative.   Musculoskeletal: Negative.   Skin:       Improving wound.    Objective: Vitals:   03/09/20 0944  BP: 124/76  Pulse: 69  Temp: (!) 97.4 F (36.3 C)  TempSrc: Oral  Weight: 206 lb (93.4 kg)   Body mass index is 27.18 kg/m.  Physical Exam Constitutional:      Appearance: Normal appearance.  HENT:     Head: Normocephalic and atraumatic.  Skin:    Comments: See picture  Neurological:     General: No focal deficit present.     Mental Status: He is alert and oriented to person, place, and time.  Psychiatric:        Mood and Affect: Mood normal.        Behavior: Behavior normal.         Pertinent Labs and Microbiology CBC Latest Ref Rng & Units 02/24/2020 02/08/2020  WBC 3.8 - 10.8 Thousand/uL 4.0 4.5  Hemoglobin 13.2 - 17.1 g/dL 14.9 14.8  Hematocrit 38 - 50  % 42.5 44.5  Platelets 140 - 400 Thousand/uL 198 226   CMP Latest Ref Rng & Units 02/24/2020 02/08/2020  Glucose 65 - 99 mg/dL 83 87  BUN 7 - 25 mg/dL 18 22  Creatinine 0.70 - 1.33 mg/dL 1.23 0.85  Sodium 135 - 146 mmol/L 136 137  Potassium 3.5 - 5.3 mmol/L 4.6 4.4  Chloride 98 - 110 mmol/L 101 103  CO2 20 - 32 mmol/L 31 27  Calcium 8.6 - 10.3 mg/dL 9.5 9.5    C-Reactive Protein     Component Value Date/Time   CRP 5.5 02/24/2020 1130   Erythrocyte Sedimentation Rate     Component Value Date/Time   ESRSEDRATE 11 02/24/2020 1130    Assessment and Plan: Hardware complicating wound infection ([facility]) Status post left humeral shaft fracture with ORIF 2/59/5638 complicated by wound infection and status post irrigation and debridement of wound and surgical site on 12/21/2019.  Cultures from this were negative for bacterial, fungal, and AFB, however, he was on oral antibiotics prior to cultures being obtained.  He completed ~7 weeks of IV antibiotics as detailed below before transitioning to TMP-SMX 1 DS tablet BID on 02/11/20.  Last seen by me on 02/24/20.  Cultured drainage from his wound which was negative.  Seen most recently 02/28/20 by Dr Peggye Ley and he continued to have drainage distally.  She reviewed CT with her partner.  They are adding a bone stimulator and plan is to see how long he can last on Bactrim while trying to get fracture union.  Today, he is doing well.  His wound distally has improved drainage and he has an area distally that bubbles up and then he pokes it with a sterile needle in order to express more fluid.  Vanco 9/1 --> 9/28 (stopped due to neutropenia) Dapto 9/29 --> 10/22 CTX 9/1 --> 10/22 TMP-SMX 10/23 --> present  Plan: -- continue TMP-SMX 1 DS tablet BID -- if unable to tolerate TMP-SMX will consider switch to other oral option -- likely will need several months of suppression -- will discuss with his surgeon and update as well -- monitor labs today with CBC,  BMP, ESR, CRP -- RTC 4 weeks  Therapeutic drug monitoring Check labs today while on Bactrim.  Vaccine counseling Previously had recommended COVID vaccination which patient has now done with Moderna vaccine x 2.    Orders Placed This Encounter  Procedures  . CBC w/Diff  . BASIC METABOLIC  PANEL WITH GFR  . Sedimentation rate  . C-reactive protein      [facility] 03/09/2020, 10:08 AM

## 2020-03-09 ENCOUNTER — Other Ambulatory Visit: Payer: Self-pay

## 2020-03-09 ENCOUNTER — Encounter: Payer: Self-pay | Admitting: Internal Medicine

## 2020-03-09 ENCOUNTER — Ambulatory Visit (INDEPENDENT_AMBULATORY_CARE_PROVIDER_SITE_OTHER): Payer: BC Managed Care – PPO | Admitting: Internal Medicine

## 2020-03-09 VITALS — BP 124/76 | HR 69 | Temp 97.4°F | Wt 206.0 lb

## 2020-03-09 DIAGNOSIS — Z5181 Encounter for therapeutic drug level monitoring: Secondary | ICD-10-CM

## 2020-03-09 DIAGNOSIS — T847XXD Infection and inflammatory reaction due to other internal orthopedic prosthetic devices, implants and grafts, subsequent encounter: Secondary | ICD-10-CM

## 2020-03-09 DIAGNOSIS — Z7185 Encounter for immunization safety counseling: Secondary | ICD-10-CM | POA: Insufficient documentation

## 2020-03-09 NOTE — Assessment & Plan Note (Signed)
Status post left humeral shaft fracture with ORIF 2/45/8099 complicated by wound infection and status post irrigation and debridement of wound and surgical site on 12/21/2019.  Cultures from this were negative for bacterial, fungal, and AFB, however, he was on oral antibiotics prior to cultures being obtained.  He completed ~7 weeks of IV antibiotics as detailed below before transitioning to TMP-SMX 1 DS tablet BID on 02/11/20.  Last seen by me on 02/24/20.  Cultured drainage from his wound which was negative.  Seen most recently 02/28/20 by Dr Peggye Ley and he continued to have drainage distally.  She reviewed CT with her partner.  They are adding a bone stimulator and plan is to see how long he can last on Bactrim while trying to get fracture union.  Today, he is doing well.  His wound distally has improved drainage and he has an area distally that bubbles up and then he pokes it with a sterile needle in order to express more fluid.  Vanco 9/1 --> 9/28 (stopped due to neutropenia) Dapto 9/29 --> 10/22 CTX 9/1 --> 10/22 TMP-SMX 10/23 --> present  Plan: -- continue TMP-SMX 1 DS tablet BID -- if unable to tolerate TMP-SMX will consider switch to other oral option -- likely will need several months of suppression -- will discuss with his surgeon and update as well -- monitor labs today with CBC, BMP, ESR, CRP -- RTC 4 weeks

## 2020-03-09 NOTE — Patient Instructions (Signed)
Thank you for coming to see me today. It was a pleasure seeing you.  Please continue the Bactrim and I'll call you with any changes.  If you have any questions or concerns, please do not hesitate to call the office at [phone].  Take Care,   Gwynn Burly, DO

## 2020-03-09 NOTE — Assessment & Plan Note (Signed)
Previously had recommended COVID vaccination which patient has now done with Moderna vaccine x 2.

## 2020-03-09 NOTE — Assessment & Plan Note (Signed)
Check labs today while on Bactrim.

## 2020-03-10 LAB — BASIC METABOLIC PANEL WITH GFR
BUN: 14 mg/dL (ref 7–25)
CO2: 28 mmol/L (ref 20–32)
Calcium: 9.4 mg/dL (ref 8.6–10.3)
Chloride: 101 mmol/L (ref 98–110)
Creat: 1.09 mg/dL (ref 0.70–1.33)
GFR, Est African American: 89 mL/min/{1.73_m2} (ref >=60)
GFR, Est Non African American: 77 mL/min/{1.73_m2} (ref >=60)
Glucose, Bld: 90 mg/dL (ref 65–99)
Potassium: 4.6 mmol/L (ref 3.5–5.3)
Sodium: 136 mmol/L (ref 135–146)

## 2020-03-10 LAB — CBC WITH DIFFERENTIAL/PLATELET
Absolute Monocytes: 477 cells/uL (ref 200–950)
Basophils Absolute: 9 cells/uL (ref 0–200)
Basophils Relative: 0.3 %
Eosinophils Absolute: 81 cells/uL (ref 15–500)
Eosinophils Relative: 2.6 %
HCT: 45.9 % (ref 38.5–50.0)
Hemoglobin: 15.6 g/dL (ref 13.2–17.1)
Lymphs Abs: 874 cells/uL (ref 850–3900)
MCH: 30.4 pg (ref 27.0–33.0)
MCHC: 34 g/dL (ref 32.0–36.0)
MCV: 89.3 fL (ref 80.0–100.0)
MPV: 10 fL (ref 7.5–12.5)
Monocytes Relative: 15.4 %
Neutro Abs: 1659 cells/uL (ref 1500–7800)
Neutrophils Relative %: 53.5 %
Platelets: 205 10*3/uL (ref 140–400)
RBC: 5.14 10*6/uL (ref 4.20–5.80)
RDW: 14.8 % (ref 11.0–15.0)
Total Lymphocyte: 28.2 %
WBC: 3.1 10*3/uL — ABNORMAL LOW (ref 3.8–10.8)

## 2020-03-10 LAB — C-REACTIVE PROTEIN: CRP: 2.7 mg/L (ref <8.0)

## 2020-03-10 LAB — SEDIMENTATION RATE: Sed Rate: 6 mm/h (ref 0–20)

## 2020-03-12 ENCOUNTER — Telehealth: Payer: Self-pay

## 2020-03-12 ENCOUNTER — Other Ambulatory Visit: Payer: Self-pay | Admitting: Internal Medicine

## 2020-03-12 DIAGNOSIS — T847XXD Infection and inflammatory reaction due to other internal orthopedic prosthetic devices, implants and grafts, subsequent encounter: Secondary | ICD-10-CM

## 2020-03-12 NOTE — Telephone Encounter (Signed)
-----   Message from Kathlynn Grate, DO sent at 03/12/2020  2:26 PM EST ----- Can you let patient know that his WBC has decreased but we can continue the Bactrim for now.  His other labs are stable, but I would like him to repeat labs in 2 weeks to ensure WBC has not gone down too much.  I will put in lab orders for him.

## 2020-03-12 NOTE — Telephone Encounter (Signed)
Patient made aware of most recent results. Patient also accepts appointment for repeat WBC. Danny Roberts

## 2020-03-30 ENCOUNTER — Other Ambulatory Visit: Payer: BC Managed Care – PPO

## 2020-03-30 ENCOUNTER — Other Ambulatory Visit: Payer: Self-pay

## 2020-03-30 DIAGNOSIS — T847XXD Infection and inflammatory reaction due to other internal orthopedic prosthetic devices, implants and grafts, subsequent encounter: Secondary | ICD-10-CM

## 2020-03-30 LAB — BASIC METABOLIC PANEL
BUN: 19 mg/dL (ref 7–25)
CO2: 27 mmol/L (ref 20–32)
Calcium: 9.3 mg/dL (ref 8.6–10.3)
Chloride: 100 mmol/L (ref 98–110)
Creat: 1.13 mg/dL (ref 0.70–1.33)
Glucose, Bld: 80 mg/dL (ref 65–99)
Potassium: 4.1 mmol/L (ref 3.5–5.3)
Sodium: 137 mmol/L (ref 135–146)

## 2020-03-30 LAB — CBC
HCT: 47.9 % (ref 38.5–50.0)
Hemoglobin: 16.2 g/dL (ref 13.2–17.1)
MCH: 31.4 pg (ref 27.0–33.0)
MCHC: 33.8 g/dL (ref 32.0–36.0)
MCV: 92.8 fL (ref 80.0–100.0)
MPV: 10.8 fL (ref 7.5–12.5)
Platelets: 197 10*3/uL (ref 140–400)
RBC: 5.16 10*6/uL (ref 4.20–5.80)
RDW: 14.9 % (ref 11.0–15.0)
WBC: 3.6 10*3/uL — ABNORMAL LOW (ref 3.8–10.8)

## 2020-04-02 ENCOUNTER — Telehealth: Payer: Self-pay

## 2020-04-02 NOTE — Telephone Encounter (Signed)
-----   Message from Kathlynn Grate, DO sent at 04/02/2020  1:14 PM EST ----- Can you please call pt and let him know that labs from 12/10 look stable and to continue with current Bactrim dose.  Thanks

## 2020-04-02 NOTE — Telephone Encounter (Signed)
Spoke with patient to let him know per Dr. Earlene Plater, his labs from 12/10 are stable and to continue with the current dose of Bactrim. Patient verbalized understanding and has no further questions.   Sandie Ano, RN

## 2020-04-09 ENCOUNTER — Other Ambulatory Visit: Payer: Self-pay

## 2020-04-09 ENCOUNTER — Encounter: Payer: Self-pay | Admitting: Internal Medicine

## 2020-04-09 ENCOUNTER — Ambulatory Visit (INDEPENDENT_AMBULATORY_CARE_PROVIDER_SITE_OTHER): Payer: BC Managed Care – PPO | Admitting: Internal Medicine

## 2020-04-09 DIAGNOSIS — T847XXD Infection and inflammatory reaction due to other internal orthopedic prosthetic devices, implants and grafts, subsequent encounter: Secondary | ICD-10-CM

## 2020-04-09 MED ORDER — SULFAMETHOXAZOLE-TRIMETHOPRIM 800-160 MG PO TABS
1.0000 | ORAL_TABLET | Freq: Two times a day (BID) | ORAL | 1 refills | Status: DC
Start: 2020-04-09 — End: 2020-05-10

## 2020-04-09 NOTE — Assessment & Plan Note (Addendum)
Last seen by me 03/09/20.  Continued on TMP-SMX since that time.  Repeat CBC and BMP most recently 03/30/20 were stable.  Had follow up with orthopedics on 03/20/20 and planned for 4-week follow up at that time.  Goes back on 04/17/20.  Received his bone stimulator about 2 weeks ago and feels like this has helped.   Overall, feeling better.  Not having drainage any more and able to use arm more.  No pain either.  On his last day of TMP-SMX.  Vanco 9/1 --> 9/28(stopped due to neutropenia) Dapto 9/29 -->10/22 CTX 9/1 -->10/22 TMP-SMX 10/23 --> present  Plan: -- continue TMP-SMX suppression.  Refill sent today.  Wound has healed well.  Hopefully follow up imaging will show fracture union -- RTC 4 weeks.  Will get labs then as his labs on 12/10 were stable

## 2020-04-09 NOTE — Progress Notes (Signed)
[facility]  CHIEF COMPLAINT:    Follow up for hardware complicating wound infection  SUBJECTIVE:    Danny Roberts is a 54 y.o. male with PMHx as below who presents to the clinic for follow up of hardware complicating wound infection.   Please see A&P for the details of today's visit and status of the patient's medical problems.   Patient's Medications  New Prescriptions   SULFAMETHOXAZOLE-TRIMETHOPRIM (BACTRIM DS) 800-160 MG TABLET    Take 1 tablet by mouth 2 (two) times daily.  Previous Medications   CYCLOBENZAPRINE (FLEXERIL) 10 MG TABLET    Take 10 mg by mouth 3 (three) times daily.   DICLOFENAC (CATAFLAM) 50 MG TABLET    diclofenac potassium 50 mg tablet  Take 1 tablet 3 times a day by oral route.  with food  Modified Medications   No medications on file  Discontinued Medications   No medications on file     Past Medical History:  Diagnosis Date  . Fracture of humeral shaft, left, closed 10/2019    Family History  Problem Relation Age of Onset  . Heart disease Father   . Diabetes Brother   . Heart disease Brother   . Tuberculosis Neg Hx     Social History   Socioeconomic History  . Marital status: Single    Spouse name: Not on file  . Number of children: Not on file  . Years of education: Not on file  . Highest education level: Not on file  Occupational History  . Not on file  Tobacco Use  . Smoking status: Never Smoker  . Smokeless tobacco: Never Used  Substance and Sexual Activity  . Alcohol use: Yes    Comment: occ beer  . Drug use: Not Currently    Types: Cocaine  . Sexual activity: Not on file  Other Topics Concern  . Not on file  Social History Narrative  . Not on file   Social Determinants of Health   Financial Resource Strain: Not on file  Food Insecurity: Not on file  Transportation Needs: Not on file  Physical Activity: Not on file  Stress: Not on file  Social Connections: Not on file   Intimate Partner Violence: Not on file    No Known Allergies  Review of Systems  Constitutional: Negative for chills and fever.  Gastrointestinal: Negative.   Musculoskeletal: Negative for joint pain.       Improved drainage.   Skin: Negative.      OBJECTIVE:    Vitals:   04/09/20 1537  BP: 136/79  Pulse: 80  Temp: (!) 97.5 F (36.4 C)  TempSrc: Oral  Weight: 212 lb (96.2 kg)   Body mass index is 27.97 kg/m.  Physical Exam Constitutional:      General: He is not in acute distress.    Appearance: Normal appearance.  HENT:     Head: Normocephalic and atraumatic.  Pulmonary:     Effort: Pulmonary effort is normal. No respiratory distress.  Musculoskeletal:        General: Signs of injury present. No swelling or tenderness. Normal range of motion.  Neurological:     Mental Status: He is alert.          Pertinent Labs and Microbiology CBC Latest Ref Rng & Units 03/30/2020 03/09/2020 02/24/2020  WBC 3.8 - 10.8 Thousand/uL 3.6(L) 3.1(L) 4.0  Hemoglobin 13.2 - 17.1 g/dL 76.7 34.1 93.7  Hematocrit 38.5 - 50.0 %  47.9 45.9 42.5  Platelets 140 - 400 Thousand/uL 197 205 198   CMP Latest Ref Rng & Units 03/30/2020 03/09/2020 02/24/2020  Glucose 65 - 99 mg/dL 80 90 83  BUN 7 - 25 mg/dL 19 14 18   Creatinine 0.70 - 1.33 mg/dL 2.26 3.33  Sodium 135 - 146 mmol/L 137 136 136  Potassium 3.5 - 5.3 mmol/L 4.1 4.6 4.6  Chloride 98 - 110 mmol/L 100 101 101  CO2 20 - 32 mmol/L 27 28 31   Calcium 8.6 - 10.3 mg/dL 9.3 9.4 9.5       ASSESSMENT & PLAN:    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  Continued on TMP-SMX since that time.  Repeat CBC and BMP most recently 03/30/20 were stable.  Had follow up with orthopedics on 03/20/20 and planned for 4-week follow up at that time.  Goes back on 04/17/20.  Received his bone stimulator about 2 weeks ago and feels like this has helped.   Overall, feeling better.  Not having drainage any more and able  to use arm more.  No pain either.  On his last day of TMP-SMX.  Vanco 9/1 --> 9/28(stopped due to neutropenia) Dapto 9/29 -->10/22 CTX 9/1 -->10/22 TMP-SMX 10/23 --> present  Plan: -- continue TMP-SMX suppression.  Refill sent today.  Wound has healed well.  Hopefully follow up imaging will show fracture union -- RTC 4 weeks.  Will get labs then as his labs on 12/10 were stable    11/23 [facility] 04/09/2020, 4:00 PM

## 2020-04-09 NOTE — Patient Instructions (Signed)
Thank you for coming to see me today. It was a pleasure seeing you.  Your labs on 03/30/20 were stable.  We will plan to repeat labs again in a few weeks when you follow up with me.    If you have any questions or concerns, please do not hesitate to call the office at [phone].  Take Care,   Gwynn Burly, DO

## 2020-04-19 ENCOUNTER — Other Ambulatory Visit: Payer: Self-pay | Admitting: Orthopaedic Surgery

## 2020-04-19 DIAGNOSIS — S42312K Greenstick fracture of shaft of humerus, left arm, subsequent encounter for fracture with nonunion: Secondary | ICD-10-CM

## 2020-04-26 ENCOUNTER — Ambulatory Visit: Payer: BC Managed Care – PPO

## 2020-05-01 ENCOUNTER — Ambulatory Visit
Admission: RE | Admit: 2020-05-01 | Discharge: 2020-05-01 | Disposition: A | Discharge status: Home / Self Care | Payer: BC Managed Care – PPO | Source: Ambulatory Visit | Attending: Orthopaedic Surgery | Admitting: Orthopaedic Surgery

## 2020-05-01 ENCOUNTER — Ambulatory Visit: Payer: BC Managed Care – PPO

## 2020-05-01 ENCOUNTER — Other Ambulatory Visit: Payer: Self-pay

## 2020-05-01 DIAGNOSIS — S42312K Greenstick fracture of shaft of humerus, left arm, subsequent encounter for fracture with nonunion: Secondary | ICD-10-CM | POA: Diagnosis present

## 2020-05-02 ENCOUNTER — Ambulatory Visit: Payer: BC Managed Care – PPO | Admitting: Internal Medicine

## 2020-05-07 ENCOUNTER — Ambulatory Visit: Payer: BC Managed Care – PPO | Admitting: Internal Medicine

## 2020-05-10 ENCOUNTER — Ambulatory Visit (INDEPENDENT_AMBULATORY_CARE_PROVIDER_SITE_OTHER): Payer: BC Managed Care – PPO | Admitting: Internal Medicine

## 2020-05-10 ENCOUNTER — Other Ambulatory Visit: Payer: Self-pay

## 2020-05-10 ENCOUNTER — Encounter: Payer: Self-pay | Admitting: Internal Medicine

## 2020-05-10 VITALS — BP 132/81 | HR 91 | Temp 97.5°F | Ht 73.0 in | Wt 216.0 lb

## 2020-05-10 DIAGNOSIS — T847XXD Infection and inflammatory reaction due to other internal orthopedic prosthetic devices, implants and grafts, subsequent encounter: Secondary | ICD-10-CM | POA: Diagnosis not present

## 2020-05-10 DIAGNOSIS — Z5181 Encounter for therapeutic drug level monitoring: Secondary | ICD-10-CM | POA: Diagnosis not present

## 2020-05-10 DIAGNOSIS — S42302A Unspecified fracture of shaft of humerus, left arm, initial encounter for closed fracture: Secondary | ICD-10-CM | POA: Diagnosis not present

## 2020-05-10 MED ORDER — SULFAMETHOXAZOLE-TRIMETHOPRIM 800-160 MG PO TABS: 1.0000 | ORAL_TABLET | Freq: Two times a day (BID) | ORAL | 0 refills | Status: DC

## 2020-05-10 NOTE — Assessment & Plan Note (Signed)
Last seen by me 04/09/20.  Had repeat CT scan on 05/01/20 which showed that there was progressive lucency along the inner margin of plate, concerning for loosening.  Possibly concerning for chronic infection but no evidence of bone destruction to suggest osteomyelitis.  His surgeon has referred him to an orthopedic trauma specialist, Dr Carola Frost, for a second opinion.  Per patient, Dr Carola Frost is planning to go in and remove old hardware, perform debridement with cultures, and then place a cement spacer for several weeks prior to reimplanting any new hardware.  I am not sure if this is delayed onset PJI unrelated to initial early PJI, recurrence of his prior PJI, or something not infectious (and just mechanical especially since he is pain free and has normal inflammatory markers).  Nonetheless, I believe he will need another course of empiric IV therapy after his surgery with antibiotics tailored to any positive cultures.  PLAN: . Continue TMP-SMX for now.  I agree with Dr Carola Frost that he should hold his antibiotics for 7-10 days prior to surgery to increase culture yield . Will ask micro lab to hold cultures for 14 days and also see if we can arrange for tissue to be sent to [facility] for 16s PCR testing . Will arrange PICC placement closer to surgery date or possibly can be done in the OR . RTC 4 weeks
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
## 2020-05-10 NOTE — Patient Instructions (Signed)
Thank you for coming to see me today. It was a pleasure seeing you.  To Do: [facility] today and I sent in a refill of your Bactrim . Please keep me updated via Mychart regarding surgery with Dr Carola Frost  If you have any questions or concerns, please do not hesitate to call the office at [phone].  Take Care,   Gwynn Burly, DO
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
## 2020-05-10 NOTE — Assessment & Plan Note (Signed)
He is currently on TMP SMX for suppressive therapy.  PLAN: . Check CBC and BMP today . Refill sent

## 2020-05-11 LAB — CBC
HCT: 45.9 % (ref 38.5–50.0)
Hemoglobin: 15.7 g/dL (ref 13.2–17.1)
MCH: 31.8 pg (ref 27.0–33.0)
MCHC: 34.2 g/dL (ref 32.0–36.0)
MCV: 92.9 fL (ref 80.0–100.0)
MPV: 11 fL (ref 7.5–12.5)
Platelets: 182 10*3/uL (ref 140–400)
RBC: 4.94 10*6/uL (ref 4.20–5.80)
RDW: 14.1 % (ref 11.0–15.0)
WBC: 3.9 10*3/uL (ref 3.8–10.8)

## 2020-05-11 LAB — BASIC METABOLIC PANEL
BUN: 19 mg/dL (ref 7–25)
CO2: 30 mmol/L (ref 20–32)
Calcium: 9.4 mg/dL (ref 8.6–10.3)
Chloride: 101 mmol/L (ref 98–110)
Creat: 1.32 mg/dL (ref 0.70–1.33)
Glucose, Bld: 80 mg/dL (ref 65–99)
Potassium: 4.6 mmol/L (ref 3.5–5.3)
Sodium: 137 mmol/L (ref 135–146)

## 2020-05-11 LAB — C-REACTIVE PROTEIN: CRP: 1.4 mg/L (ref <8.0)

## 2020-05-11 LAB — SEDIMENTATION RATE: Sed Rate: 2 mm/h (ref 0–20)

## 2020-05-14 ENCOUNTER — Telehealth: Payer: Self-pay

## 2020-05-14 NOTE — Telephone Encounter (Signed)
RN spoke with patient to relay that per Dr. Earlene Plater his labs are stable and his inflammatory markers are still within normal limits. Patient verbalized understanding and has no further questions.   Sandie Ano, RN

## 2020-05-14 NOTE — Telephone Encounter (Signed)
-----   Message from Kathlynn Grate, DO sent at 05/14/2020 11:09 AM EST ----- Can you please let patient know his labs were stable.  Inflammatory markers are still within normal limits.

## 2020-06-02 ENCOUNTER — Other Ambulatory Visit (HOSPITAL_COMMUNITY): Payer: BC Managed Care – PPO

## 2020-06-05 ENCOUNTER — Inpatient Hospital Stay (HOSPITAL_COMMUNITY)
Admission: RE | Admit: 2020-06-05 | Payer: BC Managed Care – PPO | Source: Ambulatory Visit | Admitting: Orthopedic Surgery

## 2020-06-05 ENCOUNTER — Encounter (HOSPITAL_COMMUNITY): Admission: RE | Payer: Self-pay | Source: Ambulatory Visit

## 2020-06-05 SURGERY — INSERTION, INTRAMEDULLARY ROD, HUMERUS
Anesthesia: General | Laterality: Left

## 2020-06-08 ENCOUNTER — Ambulatory Visit: Payer: BC Managed Care – PPO | Admitting: Internal Medicine

## 2020-06-08 ENCOUNTER — Encounter: Payer: Self-pay | Admitting: Internal Medicine

## 2020-06-08 ENCOUNTER — Other Ambulatory Visit: Payer: Self-pay

## 2020-06-08 VITALS — BP 155/97 | HR 97 | Temp 98.0°F | Ht 73.0 in | Wt 209.0 lb

## 2020-06-08 DIAGNOSIS — S42302A Unspecified fracture of shaft of humerus, left arm, initial encounter for closed fracture: Secondary | ICD-10-CM

## 2020-06-08 DIAGNOSIS — Z5181 Encounter for therapeutic drug level monitoring: Secondary | ICD-10-CM | POA: Diagnosis not present

## 2020-06-08 DIAGNOSIS — T847XXD Infection and inflammatory reaction due to other internal orthopedic prosthetic devices, implants and grafts, subsequent encounter: Secondary | ICD-10-CM | POA: Diagnosis not present

## 2020-06-08 MED ORDER — SULFAMETHOXAZOLE-TRIMETHOPRIM 800-160 MG PO TABS: 1.0000 | ORAL_TABLET | Freq: Two times a day (BID) | ORAL | 1 refills | Status: DC

## 2020-06-08 NOTE — Patient Instructions (Signed)
Thank you for coming to see me today. It was a pleasure seeing you.  To Do: Marland Kitchen Labs today . Continue Bactrim . I'll reach out to see if we can get you into see surgery sooner than April . Follow up in 4-6 weeks  If you have any questions or concerns, please do not hesitate to call the office at [phone].  Take Care,   Gwynn Burly, DO

## 2020-06-09 LAB — CBC WITH DIFFERENTIAL/PLATELET
Absolute Monocytes: 512 cells/uL (ref 200–950)
Basophils Absolute: 19 cells/uL (ref 0–200)
Basophils Relative: 0.6 %
Eosinophils Absolute: 99 cells/uL (ref 15–500)
Eosinophils Relative: 3.1 %
HCT: 44.9 % (ref 38.5–50.0)
Hemoglobin: 15.6 g/dL (ref 13.2–17.1)
Lymphs Abs: 870 cells/uL (ref 850–3900)
MCH: 32.8 pg (ref 27.0–33.0)
MCHC: 34.7 g/dL (ref 32.0–36.0)
MCV: 94.3 fL (ref 80.0–100.0)
MPV: 10.7 fL (ref 7.5–12.5)
Monocytes Relative: 16 %
Neutro Abs: 1699 cells/uL (ref 1500–7800)
Neutrophils Relative %: 53.1 %
Platelets: 177 10*3/uL (ref 140–400)
RBC: 4.76 10*6/uL (ref 4.20–5.80)
RDW: 13 % (ref 11.0–15.0)
Total Lymphocyte: 27.2 %
WBC: 3.2 10*3/uL — ABNORMAL LOW (ref 3.8–10.8)

## 2020-06-09 LAB — BASIC METABOLIC PANEL WITH GFR
BUN: 17 mg/dL (ref 7–25)
CO2: 28 mmol/L (ref 20–32)
Calcium: 9.4 mg/dL (ref 8.6–10.3)
Chloride: 102 mmol/L (ref 98–110)
Creat: 1.04 mg/dL (ref 0.70–1.33)
GFR, Est African American: 94 mL/min/{1.73_m2} (ref >=60)
GFR, Est Non African American: 81 mL/min/{1.73_m2} (ref >=60)
Glucose, Bld: 84 mg/dL (ref 65–99)
Potassium: 4.8 mmol/L (ref 3.5–5.3)
Sodium: 138 mmol/L (ref 135–146)

## 2020-06-09 LAB — SEDIMENTATION RATE: Sed Rate: 9 mm/h (ref 0–20)

## 2020-06-09 LAB — C-REACTIVE PROTEIN: CRP: 2.4 mg/L (ref <8.0)

## 2020-06-11 ENCOUNTER — Encounter: Payer: Self-pay | Admitting: Internal Medicine

## 2020-06-11 NOTE — Progress Notes (Signed)
[facility]  CHIEF COMPLAINT:    Follow up for wound infection  SUBJECTIVE:    Danny Roberts is a 55 y.o. male with PMHx as below who presents to the clinic for wound infection.   He was scheduled for surgery with Dr Carola Frost last week but this was canceled due to insurance issues.  He had stopped his TMP SMX a few days prior to scheduled surgery but then resumed it after this was cancelled.  He has been referred to [facility] for ortho trauma there but cannot get an appointment until April which is frustrating to him.  His arm has been sore but no drainage or dehiscence from his wound.  No fevers or chills.  He is otherwise tolerating TMP SMX.   Please see A&P for the details of today's visit and status of the patient's medical problems.   Patient's Medications  New Prescriptions   SULFAMETHOXAZOLE-TRIMETHOPRIM (BACTRIM DS) 800-160 MG TABLET    Take 1 tablet by mouth 2 (two) times daily.  Previous Medications   CYCLOBENZAPRINE (FLEXERIL) 10 MG TABLET    Take 10 mg by mouth 2 (two) times daily.   DICLOFENAC (CATAFLAM) 50 MG TABLET    Take 50 mg by mouth 2 (two) times daily.   LATANOPROST (XALATAN) 0.005 % OPHTHALMIC SOLUTION    Place 1 drop into both eyes at bedtime.   MULTIPLE VITAMINS-MINERALS (MULTIVITAMIN WITH MINERALS) TABLET    Take 1 tablet by mouth daily.   PROBIOTIC PRODUCT (PROBIOTIC DAILY) CAPS    Take 1 capsule by mouth daily.  Modified Medications   No medications on file  Discontinued Medications   No medications on file      Past Medical History:  Diagnosis Date  . Fracture of humeral shaft, left, closed 10/2019    Social History   Tobacco Use  . Smoking status: Never Smoker  . Smokeless tobacco: Never Used  Substance Use Topics  . Alcohol use: Yes    Comment: occ beer  . Drug use: Not Currently    Types: Cocaine    Family History  Problem Relation Age of Onset  . Heart disease Father   . Diabetes Brother   . Heart  disease Brother   . Tuberculosis Neg Hx     No Known Allergies  Review of Systems  Constitutional: Negative for fever.  Gastrointestinal: Negative for diarrhea, nausea and vomiting.  Musculoskeletal: Positive for joint pain.     OBJECTIVE:    Vitals:   06/08/20 0927  BP: (!) 155/97  Pulse: 97  Temp: 98 F (36.7 C)  Weight: 209 lb (94.8 kg)  Height: 6\' 1"  (1.854 m)   Body mass index is 27.57 kg/m.  Physical Exam Constitutional:      General: He is not in acute distress.    Appearance: Normal appearance.  Pulmonary:     Effort: Pulmonary effort is normal. No respiratory distress.  Musculoskeletal:     Comments: Left arm surgical incision without drainage or dehiscence.   Neurological:     General: No focal deficit present.     Mental Status: He is alert and oriented to person, place, and time.      Labs and Microbiology: CBC Latest Ref Rng & Units 06/08/2020 05/10/2020 03/30/2020  WBC 3.8 - 10.8 Thousand/uL 3.2(L) 3.9 3.6(L)  Hemoglobin 13.2 - 17.1 g/dL 14/01/2020 16.1 09.6  Hematocrit 38.5 - 50.0 % 44.9 45.9 47.9  Platelets 140 - 400 Thousand/uL 177  182 197   CMP Latest Ref Rng & Units 06/08/2020 05/10/2020 03/30/2020  Glucose 65 - 99 mg/dL 84 80 80  BUN 7 - 25 mg/dL 17 19 19   Creatinine 0.70 - 1.33 mg/dL 9.37 9.02  Sodium 135 - 146 mmol/L 138 137 137  Potassium 3.5 - 5.3 mmol/L 4.8 4.6 4.1  Chloride 98 - 110 mmol/L 102 101 100  CO2 20 - 32 mmol/L 28 30 27   Calcium 8.6 - 10.3 mg/dL 9.4 9.4 9.3      ASSESSMENT & PLAN:    Therapeutic drug monitoring Continues on TMP SMX for suppressive therapy.  CBC and BMP today looked fine and inflammatory markers remain normal.  Refill sent to pharmacy.    Hardware complicating wound infection (HCC) Last seen by me 05/10/20.  Will have him continue on TMP SMX for now pending surgical plans.  I have reached out to his first surgeon who is trying to see if she can get him into [facility] sooner.     Orders Placed This Encounter   Procedures  . BASIC METABOLIC PANEL WITH GFR  . CBC w/Diff  . Sedimentation rate  . C-reactive protein       05/12/20 for Infectious Disease [facility] 06/11/2020, 9:44 AM

## 2020-06-11 NOTE — Assessment & Plan Note (Signed)
Last seen by me 05/10/20.  Will have him continue on TMP SMX for now pending surgical plans.  I have reached out to his first surgeon who is trying to see if she can get him into [facility] sooner.

## 2020-06-11 NOTE — Assessment & Plan Note (Signed)
Continues on TMP SMX for suppressive therapy.  CBC and BMP today looked fine and inflammatory markers remain normal.  Refill sent to pharmacy.

## 2020-07-16 ENCOUNTER — Ambulatory Visit: Payer: BC Managed Care – PPO | Admitting: Internal Medicine

## 2020-07-16 ENCOUNTER — Other Ambulatory Visit: Payer: Self-pay

## 2020-07-16 ENCOUNTER — Encounter: Payer: Self-pay | Admitting: Internal Medicine

## 2020-07-16 VITALS — BP 141/82 | HR 75 | Temp 97.9°F

## 2020-07-16 DIAGNOSIS — T847XXD Infection and inflammatory reaction due to other internal orthopedic prosthetic devices, implants and grafts, subsequent encounter: Secondary | ICD-10-CM

## 2020-07-16 DIAGNOSIS — Z5181 Encounter for therapeutic drug level monitoring: Secondary | ICD-10-CM | POA: Diagnosis not present

## 2020-07-16 MED ORDER — CEFADROXIL 500 MG PO CAPS: 500.0000 mg | ORAL_CAPSULE | Freq: Two times a day (BID) | ORAL | 2 refills | Status: AC

## 2020-07-16 NOTE — Progress Notes (Signed)
[facility]  CHIEF COMPLAINT:    Follow up for hardware complicating wound infection  SUBJECTIVE:    Danny Roberts is a 55 y.o. male with PMHx as below who presents to the clinic for follow-up of hardware complicating wound infection.   I last saw him on June 08, 2020 and he was continued on TMP SMX as suppressive therapy in the setting of an osteofixation infection until he could see a second orthopedic specialist.  Since that time he was seen by Dr. Bridgett Larsson at [facility] on June 14, 2020.  He had plain films done that day which was reassuring that he may have some healing across his fracture site.  Given that he did not have any open wounds or evidence of infection and has not had any issues with wound drainage since November they recommended continued nonoperative management to see if he is able to heal his fracture.  They recommended he increase his use of the bone stimulator as well as take vitamin D and calcium supplementation.  He plans to follow-up with them in May with repeat x-rays at that time to assess for further healing.  He has otherwise been doing well since last time I saw him.  Labs on 06/08/2020 notable for mildly decreased WBC 3.2, normal BMP, and normal ESR/CRP.  He has continued on Bactrim since that visit and has not noted any side effects.  He continues to be relatively pain-free in regards to his left upper extremity with occasional discomfort at the distal part of his humerus.  He has had no issues with drainage, redness, swelling, or fevers.  Please see A&P for the details of today's visit and status of the patient's medical problems.   Patient's Medications  New Prescriptions   CEFADROXIL (DURICEF) 500 MG CAPSULE    Take 1 capsule (500 mg total) by mouth 2 (two) times daily.  Previous Medications   CYCLOBENZAPRINE (FLEXERIL) 10 MG TABLET    Take 10 mg by mouth 2 (two) times daily.   DICLOFENAC (CATAFLAM) 50 MG TABLET    Take 50  mg by mouth 2 (two) times daily.   LATANOPROST (XALATAN) 0.005 % OPHTHALMIC SOLUTION    Place 1 drop into both eyes at bedtime.   MULTIPLE VITAMINS-MINERALS (MULTIVITAMIN WITH MINERALS) TABLET    Take 1 tablet by mouth daily.   PROBIOTIC PRODUCT (PROBIOTIC DAILY) CAPS    Take 1 capsule by mouth daily.  Modified Medications   No medications on file  Discontinued Medications   SULFAMETHOXAZOLE-TRIMETHOPRIM (BACTRIM DS) 800-160 MG TABLET    Take 1 tablet by mouth 2 (two) times daily.      Past Medical History:  Diagnosis Date  . Fracture of humeral shaft, left, closed 10/2019    Social History   Tobacco Use  . Smoking status: Never Smoker  . Smokeless tobacco: Never Used  Substance Use Topics  . Alcohol use: Yes    Comment: occ beer  . Drug use: Not Currently    Types: Cocaine    Family History  Problem Relation Age of Onset  . Heart disease Father   . Diabetes Brother   . Heart disease Brother   . Tuberculosis Neg Hx     No Known Allergies  Review of Systems  Constitutional: Negative for chills and fever.  Gastrointestinal: Negative for abdominal pain, diarrhea, nausea and vomiting.  Musculoskeletal: Negative for joint pain.  Skin: Negative for rash.     OBJECTIVE:  Vitals:   07/16/20 1657  BP: (!) 141/82  Pulse: 75  Temp: 97.9 F (36.6 C)  TempSrc: Oral  SpO2: 95%   There is no height or weight on file to calculate BMI.  Physical Exam Constitutional:      General: He is not in acute distress.    Appearance: Normal appearance.  Pulmonary:     Effort: Pulmonary effort is normal. No respiratory distress.  Musculoskeletal:     Comments: Left humerus surgical incision healed with no erythema or drainage.  Able to actively move his elbow without discomfort  Skin:    General: Skin is warm and dry.  Neurological:     General: No focal deficit present.     Mental Status: He is alert and oriented to person, place, and time.  Psychiatric:        Mood and  Affect: Mood normal.        Behavior: Behavior normal.      Labs and Microbiology: CBC Latest Ref Rng & Units 06/08/2020 05/10/2020 03/30/2020  WBC 3.8 - 10.8 Thousand/uL 3.2(L) 3.9 3.6(L)  Hemoglobin 13.2 - 17.1 g/dL 15.6 15.7 16.2  Hematocrit 38.5 - 50.0 % 44.9 45.9 47.9  Platelets 140 - 400 Thousand/uL 177 182 197   CMP Latest Ref Rng & Units 06/08/2020 05/10/2020 03/30/2020  Glucose 65 - 99 mg/dL 84 80 80  BUN 7 - 25 mg/dL _0 Creatinine 0.70 - 1.33 mg/dL 1.04 1.32 1.13  Sodium 135 - 146 mmol/L 138 137 137  Potassium 3.5 - 5.3 mmol/L 4.8 4.6 4.1  Chloride 98 - 110 mmol/L 102 101 100  CO2 20 - 32 mmol/L _1 Calcium 8.6 - 10.3 mg/dL 9.4 9.4 9.3      ASSESSMENT & PLAN:    Therapeutic drug monitoring He is currently on Bactrim for suppressive therapy.  CBC and BMP approximately 6 weeks ago was reassuring as were his inflammatory markers.  We will transition him to cefadroxil due to better long-term tolerance and decreased need for frequent lab monitoring.  Hardware complicating wound infection ([place]) Last seen by me on 06/08/2020 and in the interim has been evaluated by orthopedic surgery at [facility] who recommended continued nonoperative management given that repeat plain films appear to have evidence of some healing across his fracture site.  Due to the concern for infection as a possible etiology of his delayed fracture healing, will continue with suppressive antibiotic therapy to treat similarly to a osteofixation/prosthetic joint infection given the presence of retained hardware.  Will transition him from TMP SMX to cefadroxil 500 mg twice daily in anticipation that this will be better tolerated long-term without the need for as frequent lab monitoring then with TMP SMX.  We will check CBC, BMP, ESR, CRP today and see patient back in 3 months.  He was advised to let us know if the cost of cefadroxil is too great.  If so, can plan for cephalexin or doxycycline.   Orders Placed  This Encounter  Procedures  . CBC w/Diff  . BASIC METABOLIC PANEL WITH GFR  . Sedimentation rate  . C-reactive protein      Raynelle Highland for Infectious Disease [facility] 07/16/2020, 5:17 PM

## 2020-07-16 NOTE — Patient Instructions (Signed)
Thank you for coming to see me today. It was a pleasure seeing you.  To Do: Marland Kitchen Labs today and I will let you know about the results when I get them . Stop taking the Bactrim and start taking Cefadroxil for suppressive antibiotics . Follow up with me in 3 months.  . If the cost of antibiotics is too high, please let us know so we can prescribe something else  If you have any questions or concerns, please do not hesitate to call the office at [phone].  Take Care,   Gwynn Burly, DO

## 2020-07-16 NOTE — Assessment & Plan Note (Signed)
He is currently on Bactrim for suppressive therapy.  CBC and BMP approximately 6 weeks ago was reassuring as were his inflammatory markers.  We will transition him to cefadroxil due to better long-term tolerance and decreased need for frequent lab monitoring.

## 2020-07-16 NOTE — Assessment & Plan Note (Signed)
Last seen by me on 06/08/2020 and in the interim has been evaluated by orthopedic surgery at [facility] who recommended continued nonoperative management given that repeat plain films appear to have evidence of some healing across his fracture site.  Due to the concern for infection as a possible etiology of his delayed fracture healing, will continue with suppressive antibiotic therapy to treat similarly to a osteofixation/prosthetic joint infection given the presence of retained hardware.  Will transition him from TMP SMX to cefadroxil 500 mg twice daily in anticipation that this will be better tolerated long-term without the need for as frequent lab monitoring then with TMP SMX.  We will check CBC, BMP, ESR, CRP today and see patient back in 3 months.  He was advised to let us know if the cost of cefadroxil is too great.  If so, can plan for cephalexin or doxycycline.

## 2020-07-17 ENCOUNTER — Telehealth: Payer: Self-pay

## 2020-07-17 LAB — CBC WITH DIFFERENTIAL/PLATELET
Absolute Monocytes: 556 cells/uL (ref 200–950)
Basophils Absolute: 22 cells/uL (ref 0–200)
Basophils Relative: 0.4 %
Eosinophils Absolute: 99 cells/uL (ref 15–500)
Eosinophils Relative: 1.8 %
HCT: 43.9 % (ref 38.5–50.0)
Hemoglobin: 14.9 g/dL (ref 13.2–17.1)
Lymphs Abs: 985 cells/uL (ref 850–3900)
MCH: 32.3 pg (ref 27.0–33.0)
MCHC: 33.9 g/dL (ref 32.0–36.0)
MCV: 95.2 fL (ref 80.0–100.0)
MPV: 10.6 fL (ref 7.5–12.5)
Monocytes Relative: 10.1 %
Neutro Abs: 3839 cells/uL (ref 1500–7800)
Neutrophils Relative %: 69.8 %
Platelets: 182 10*3/uL (ref 140–400)
RBC: 4.61 10*6/uL (ref 4.20–5.80)
RDW: 13.9 % (ref 11.0–15.0)
Total Lymphocyte: 17.9 %
WBC: 5.5 10*3/uL (ref 3.8–10.8)

## 2020-07-17 LAB — BASIC METABOLIC PANEL WITH GFR
BUN: 21 mg/dL (ref 7–25)
CO2: 29 mmol/L (ref 20–32)
Calcium: 9.9 mg/dL (ref 8.6–10.3)
Chloride: 102 mmol/L (ref 98–110)
Creat: 1.1 mg/dL (ref 0.70–1.33)
GFR, Est African American: 88 mL/min/{1.73_m2} (ref >=60)
GFR, Est Non African American: 76 mL/min/{1.73_m2} (ref >=60)
Glucose, Bld: 82 mg/dL (ref 65–99)
Potassium: 4.3 mmol/L (ref 3.5–5.3)
Sodium: 138 mmol/L (ref 135–146)

## 2020-07-17 LAB — SEDIMENTATION RATE: Sed Rate: 2 mm/h (ref 0–20)

## 2020-07-17 LAB — C-REACTIVE PROTEIN: CRP: 1.2 mg/L (ref <8.0)

## 2020-07-17 NOTE — Telephone Encounter (Signed)
-----   Message from Kathlynn Grate, DO sent at 07/17/2020 10:19 AM EDT ----- Please let patient know that labs are normal.  Continue with plan as outlined during our visit.

## 2020-07-17 NOTE — Telephone Encounter (Signed)
Connected with patient and made aware of results and reminded of follow up appointment. Patient appreciative of update.  Danny Roberts

## 2020-10-16 ENCOUNTER — Ambulatory Visit: Payer: BC Managed Care – PPO | Admitting: Internal Medicine

## 2020-10-16 ENCOUNTER — Encounter: Payer: Self-pay | Admitting: Internal Medicine

## 2020-10-16 ENCOUNTER — Other Ambulatory Visit: Payer: Self-pay

## 2020-10-16 VITALS — BP 135/81 | HR 77 | Temp 97.1°F | Wt 210.0 lb

## 2020-10-16 DIAGNOSIS — Z5181 Encounter for therapeutic drug level monitoring: Secondary | ICD-10-CM

## 2020-10-16 DIAGNOSIS — T847XXD Infection and inflammatory reaction due to other internal orthopedic prosthetic devices, implants and grafts, subsequent encounter: Secondary | ICD-10-CM | POA: Diagnosis not present

## 2020-10-16 MED ORDER — CEFADROXIL 500 MG PO CAPS
500.0000 mg | ORAL_CAPSULE | Freq: Two times a day (BID) | ORAL | 5 refills | Status: DC
Start: 2020-10-16 — End: 2021-01-10

## 2020-10-16 NOTE — Assessment & Plan Note (Addendum)
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  His OR cultures were negative, however, they were obtained in the setting of having been on antibiotics prior to surgery.  We have continued him on chronic suppressive antibiotics with TMP SMX--> cefadroxil due to the concern for infection as a possible etiology of his delayed fracture healing.  Fortunately, his inflammatory markers have been normal and he has not had any worsening symptoms.  Discussed with patient that I am not entirely sure if his fracture non-healing is related to infection at this point or something noninfectious given his normal inflammatory markers and being pain-free.  Discussed that if there was any infection there is no certainty as to whether it has been cured or simply being suppressed at this time and the only true test of cure would be to stop his antibiotics and see how he does.  Patient prefers to continue suppressive antibiotics which I think is reasonable until he goes in for his surgical procedure in the next few months.  I sent in a refill of cefadroxil 584m PO BID and will obtain monitoring labs today with CBC, BMP, ESR, CRP.  RTC 3 months.  [facility] and, if he decides to do so, I will be happy to help facilitate this with their ID division.

## 2020-10-16 NOTE — Assessment & Plan Note (Signed)
Will check CBC, BMP, and inflammatory markers today.

## 2020-10-16 NOTE — Patient Instructions (Signed)
Thank you for coming to see me today. It was a pleasure seeing you.  To Do: Continue cefadroxil 500mg  twice daily Labs today Follow up with me in 3 months, sooner if needed  If you have any questions or concerns, please do not hesitate to call the office at [phone].  Take Care,   [phone]

## 2020-10-16 NOTE — Progress Notes (Signed)
[place] for Infectious Disease  CHIEF COMPLAINT:    Follow up for hardware complicating wound infection.  SUBJECTIVE:    Danny Roberts is a 55 y.o. male with PMHx as below who presents to the clinic for hardware complicating wound infection.   I previously saw Mr. Kauth on July 16, 2020.  At that time we transitioned him to cefadroxil due to better long-term tolerance and decreased need for frequent lab monitoring.  At that time his CBC, BMP, ESR, and CRP were all normal.  He has continued on cefadroxil without any noticeable adverse effects.  He continues to be relatively pain-free in regards to his left upper extremity.  Occasionally he will notice discomfort like he bumped his elbow, but he has had no further issues with drainage, swelling, redness, or fevers.  He was seen last month on Aug 30, 2020 at orthopedic clinic [facility] by Dr. Rolly Salter.  He was noted to have been taking supplemental vitamin D and calcium in addition to optimizing his nutrition.  He is able to work without significant difficulty and continues to refrain from significant heavy lifting activities.  Plain films of the left humerus were obtained at that time which showed persistent evidence of broken hardware, however, no appreciable hardware malfunction.  There appeared to be slight interval callus formation, however, not fully healed.  They discussed multiple treatment options with patient including continuing bone stimulator, obtaining CT scan to evaluate for further healing, proceeding with surgical debridement and removal of hardware with biopsy and staged fixation versus removal of hardware and single-stage revision fixation.  Patient opted for continued monitoring and nonoperative management at this time.  He will return to follow-up with orthopedic surgery in approximately 3 months and reports the plan after the Summer time will be to stop antibiotics 2-3 weeks prior to going in for surgery with  hardware removal, debridement, and fixation followed by another course of IV antibiotics.  Please see A&P for the details of today's visit and status of the patient's medical problems.   Patient's Medications  New Prescriptions   CEFADROXIL (DURICEF) 500 MG CAPSULE    Take 1 capsule (500 mg total) by mouth 2 (two) times daily.  Previous Medications   CYCLOBENZAPRINE (FLEXERIL) 10 MG TABLET    Take 10 mg by mouth 2 (two) times daily.   DICLOFENAC (CATAFLAM) 50 MG TABLET    Take 50 mg by mouth 2 (two) times daily.   LATANOPROST (XALATAN) 0.005 % OPHTHALMIC SOLUTION    Place 1 drop into both eyes at bedtime.   MULTIPLE VITAMINS-MINERALS (MULTIVITAMIN WITH MINERALS) TABLET    Take 1 tablet by mouth daily.   PROBIOTIC PRODUCT (PROBIOTIC DAILY) CAPS    Take 1 capsule by mouth daily.  Modified Medications   No medications on file  Discontinued Medications   No medications on file      Past Medical History:  Diagnosis Date   Fracture of humeral shaft, left, closed 10/2019    Social History   Tobacco Use   Smoking status: Never   Smokeless tobacco: Never  Substance Use Topics   Alcohol use: Yes    Comment: occ beer   Drug use: Not Currently    Types: Cocaine    Family History  Problem Relation Age of Onset   Heart disease Father    Diabetes Brother    Heart disease Brother    Tuberculosis Neg Hx     No Known Allergies  Review of Systems  Constitutional:  Negative for fever.  Musculoskeletal:  Negative for joint pain.  Skin:  Negative for rash.    OBJECTIVE:    Vitals:   10/16/20 1605  BP: 135/81  Pulse: 77  Temp: (!) 97.1 F (36.2 C)  TempSrc: Oral  Weight: 210 lb (95.3 kg)   Body mass index is 27.71 kg/m.  Physical Exam Constitutional:      General: He is not in acute distress.    Appearance: Normal appearance.  HENT:     Head: Normocephalic and atraumatic.  Pulmonary:     Effort: Pulmonary effort is normal. No respiratory distress.  Musculoskeletal:      Comments: Left upper extremity appears with a well-healed surgical incision, no drainage, no significant swelling.  No pain with or tenderness to palpation.  Range of motion grossly intact.  Skin:    General: Skin is warm and dry.     Findings: No rash.  Neurological:     General: No focal deficit present.     Mental Status: He is alert and oriented to person, place, and time.  Psychiatric:        Mood and Affect: Mood normal.        Behavior: Behavior normal.      Labs and Microbiology: CBC Latest Ref Rng & Units 07/16/2020 06/08/2020 05/10/2020  WBC 3.8 - 10.8 Thousand/uL 5.5 3.2(L) 3.9  Hemoglobin 13.2 - 17.1 g/dL 14.9 15.6 15.7  Hematocrit 38.5 - 50.0 % 43.9 44.9 45.9  Platelets 140 - 400 Thousand/uL 182 177 182   CMP Latest Ref Rng & Units 07/16/2020 06/08/2020 05/10/2020  Glucose 65 - 99 mg/dL 82 84 80  BUN 7 - 25 mg/dL _0 Creatinine 0.70 - 1.33 mg/dL 1.10 1.04 1.32  Sodium 135 - 146 mmol/L 138 138 137  Potassium 3.5 - 5.3 mmol/L 4.3 4.8 4.6  Chloride 98 - 110 mmol/L 102 102 101  CO2 20 - 32 mmol/L _1 Calcium 8.6 - 10.3 mg/dL 9.9 9.4 9.4     No results found for this or any previous visit (from the past 240 hour(s)).  Imaging: 08/30/2020 4:54 PM EDT   1. 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 Similar areas of perihilar lucency about multiple interlocking screws with question of slight increased lucency about the seventh screw.  2. Possible slight interval backing out of the fifyh interlocking screw, however this may be accentuated by differences in projection.     ASSESSMENT & PLAN:    Therapeutic drug monitoring Will check CBC, BMP, and inflammatory markers today.  Hardware complicating wound infection ([place]) Discussed with patient regarding continued fracture nonunion approximately 11 months from his initial  surgery.  His OR cultures were negative, however, they were obtained in the setting of having been on antibiotics prior to surgery.  We have continued him on chronic suppressive antibiotics with TMP SMX--> cefadroxil due to the concern for infection as a possible etiology of his delayed fracture healing.  Fortunately, his inflammatory markers have been normal and he has not had any worsening symptoms.  Discussed with patient that I am not entirely sure if his fracture non-healing is related to infection at this point or something noninfectious given his normal inflammatory markers and being pain-free.  Discussed that if there was any infection there is no certainty as to whether it has been cured or simply being suppressed  at this time and the only true test of cure would be to stop his antibiotics and see how he does.  Patient prefers to continue suppressive antibiotics which I think is reasonable until he goes in for his surgical procedure in the next few months.  I sent in a refill of cefadroxil 500mg PO BID and will obtain monitoring labs today with CBC, BMP, ESR, CRP.  RTC 3 months.  Patient may also prefer to transfer his care to [facility] where his surgery will be and, if he decides to do so, I will be happy to help facilitate this with their ID division.    Orders Placed This Encounter  Procedures   CBC   Basic metabolic panel    Order Specific Question:   Has the patient fasted?    Answer:   No   Sedimentation rate   C-reactive protein        [facility] 10/16/2020, 4:30 PM    

## 2020-10-17 ENCOUNTER — Telehealth: Payer: Self-pay

## 2020-10-17 LAB — BASIC METABOLIC PANEL
BUN: 17 mg/dL (ref 7–25)
CO2: 30 mmol/L (ref 20–32)
Calcium: 9.8 mg/dL (ref 8.6–10.3)
Chloride: 104 mmol/L (ref 98–110)
Creat: 0.92 mg/dL (ref 0.70–1.33)
Glucose, Bld: 81 mg/dL (ref 65–99)
Potassium: 4.7 mmol/L (ref 3.5–5.3)
Sodium: 141 mmol/L (ref 135–146)

## 2020-10-17 LAB — SEDIMENTATION RATE: Sed Rate: 6 mm/h (ref 0–20)

## 2020-10-17 LAB — CBC
HCT: 41.6 % (ref 38.5–50.0)
Hemoglobin: 14.2 g/dL (ref 13.2–17.1)
MCH: 32.8 pg (ref 27.0–33.0)
MCHC: 34.1 g/dL (ref 32.0–36.0)
MCV: 96.1 fL (ref 80.0–100.0)
MPV: 11.3 fL (ref 7.5–12.5)
Platelets: 159 10*3/uL (ref 140–400)
RBC: 4.33 10*6/uL (ref 4.20–5.80)
RDW: 12.2 % (ref 11.0–15.0)
WBC: 4.9 10*3/uL (ref 3.8–10.8)

## 2020-10-17 LAB — C-REACTIVE PROTEIN: CRP: 2.9 mg/L (ref <8.0)

## 2020-10-17 NOTE — Telephone Encounter (Signed)
-----   Message from Kathlynn Grate, DO sent at 10/17/2020 12:19 PM EDT ----- Can you please let pt know that his labs were normal and to continue cefadroxil as prescribed.  Thanks

## 2020-10-17 NOTE — Telephone Encounter (Signed)
Spoke with patient, notified him that per Dr. Earlene Plater his labs are normal and he should continue cefadroxil as prescribed. Patient verbalized understanding and has no further questions.   Sandie Ano, RN

## 2021-01-10 ENCOUNTER — Other Ambulatory Visit: Payer: Self-pay | Admitting: Internal Medicine

## 2021-01-16 ENCOUNTER — Ambulatory Visit: Payer: BC Managed Care – PPO | Admitting: Internal Medicine

## 2022-06-08 IMAGING — CR DG HUMERUS 2V *L*
1 series · 2 of 2 positions shown · non-contrast
Comparison: None.

CLINICAL DATA: Pain

EXAM:
LEFT HUMERUS - 2+ VIEW

[Series 1: dg humerus left · 0.14mm/px · 2 of 2 slices shown]
[im 1/2]
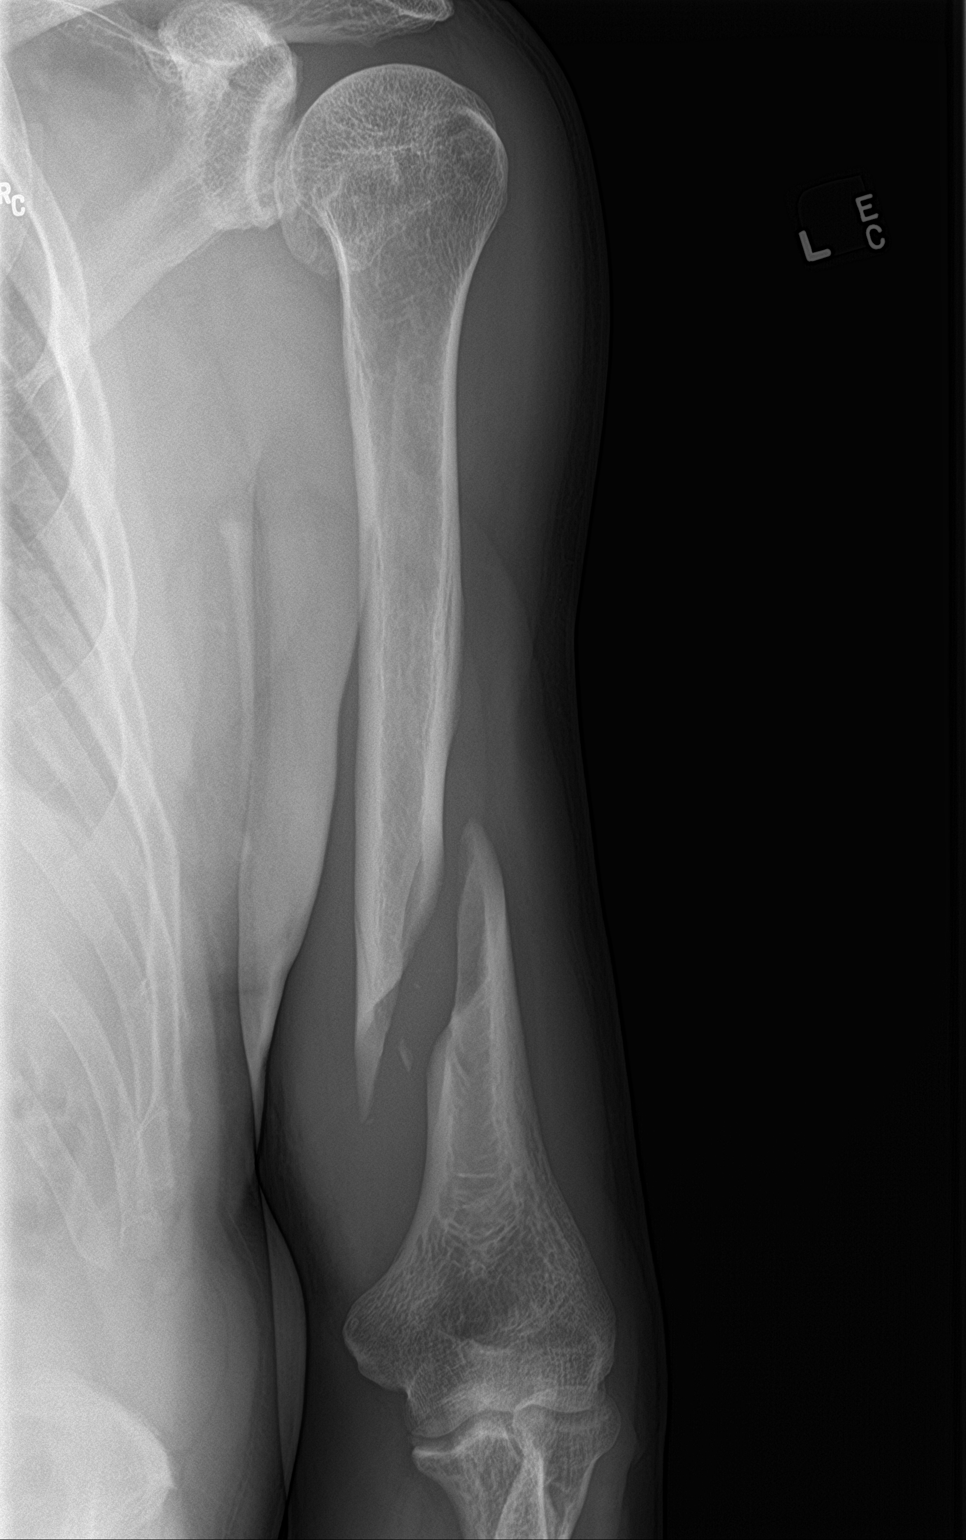
[im 2/2]
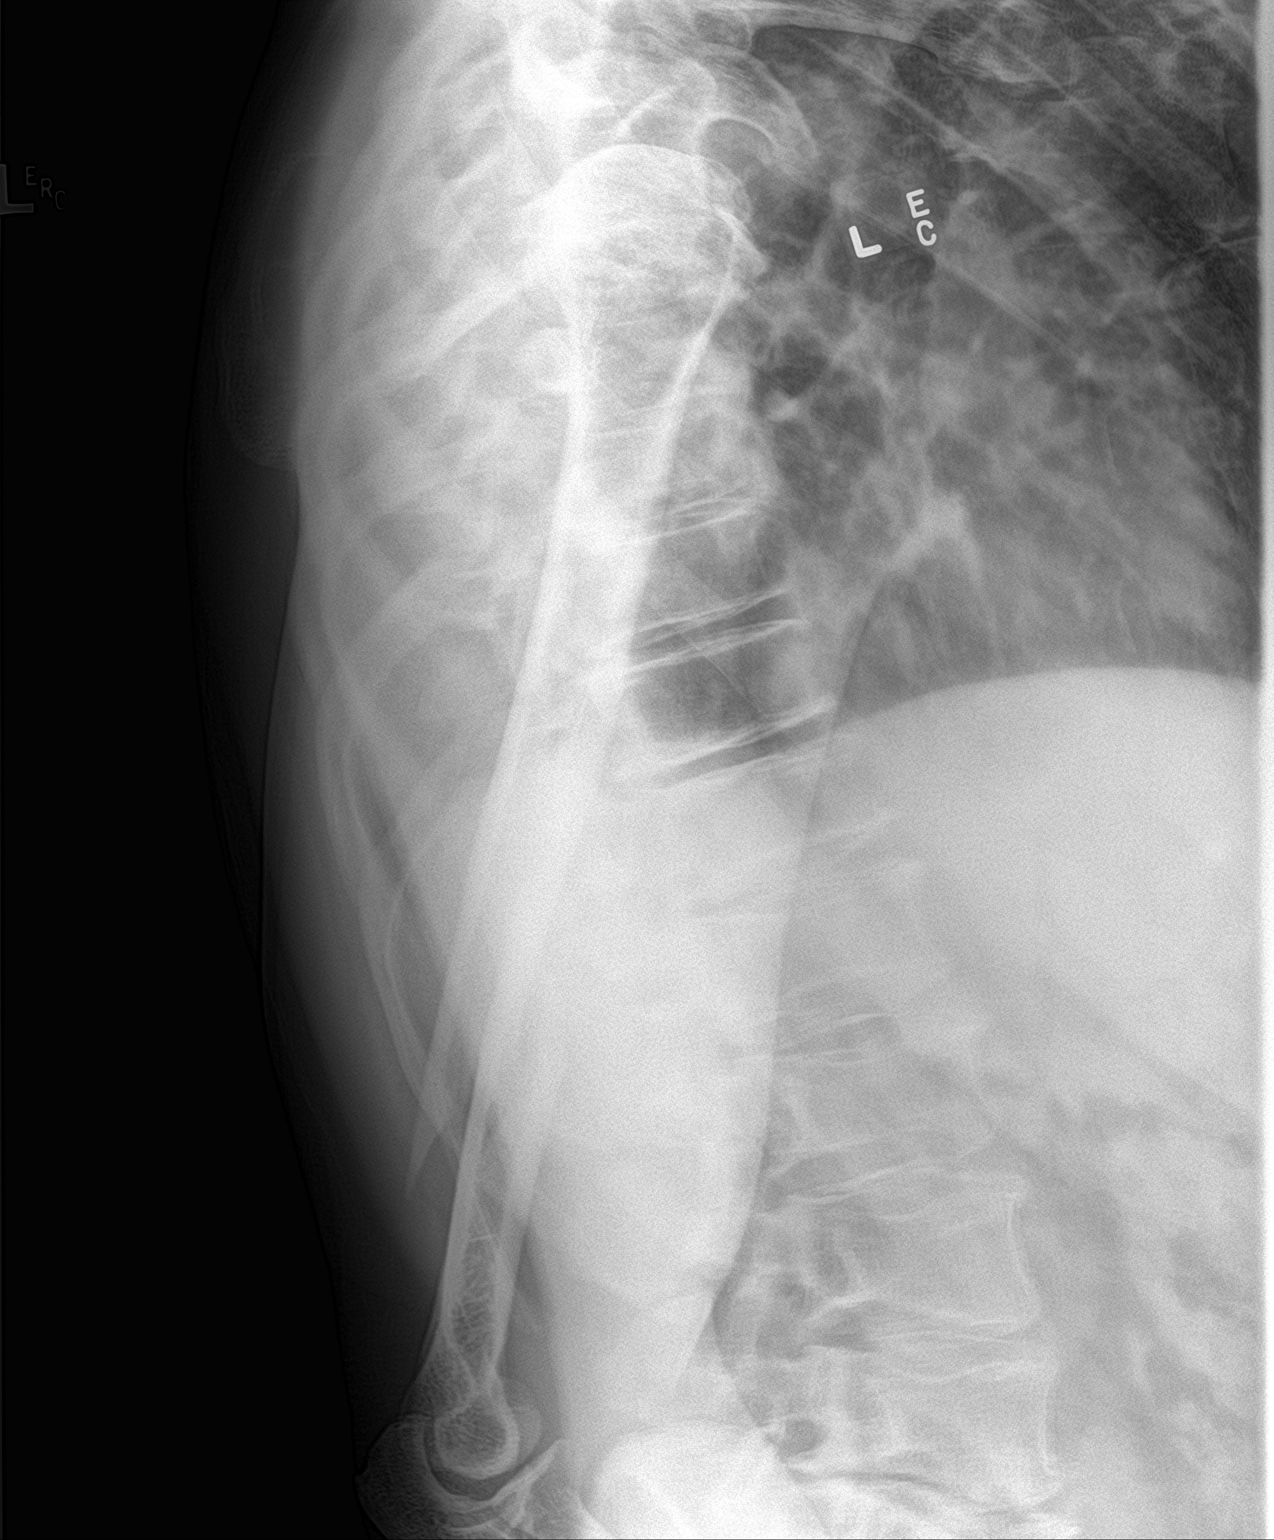

[2 of 2 positions shown; findings below may reference images not displayed]

FINDINGS: There is an acute, oblique and significantly displaced fracture of
the distal left humerus. There is surrounding soft tissue swelling.
There are moderate degenerative changes of the left glenohumeral
joint.
IMPRESSION: Acute, oblique and significantly displaced fracture of the distal
left humerus.

## 2022-11-25 IMAGING — CT CT HUMERUS*L* W/O CM
3 series · 9 of 36 positions shown, 10 images · non-contrast
Comparison: CT left humerus dated February 22, 2020.

CLINICAL DATA: Left humerus fracture status post ORIF complicated
by infection.

EXAM:
CT OF THE UPPER LEFT EXTREMITY WITHOUT CONTRAST
TECHNIQUE: Multidetector CT imaging of the left upper arm was performed
according to the standard protocol.

[Series 8: axial st · axial · 0.33mm/px · z∈[-587,-587]mm · 1 of 398 slices shown, 2 images]
[im 214/398  soft-tissue]
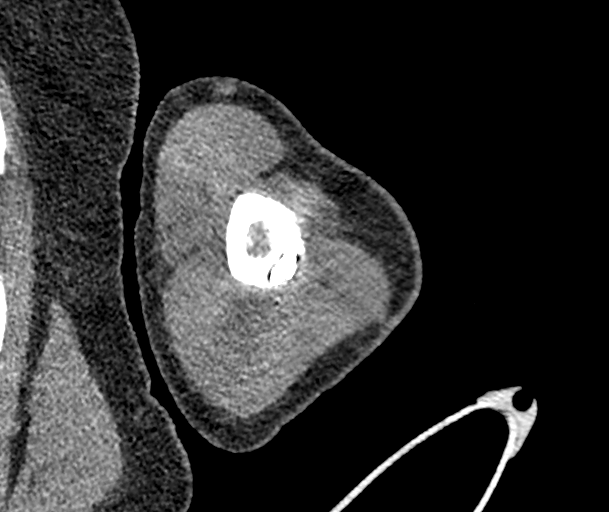
[im 214/398  bone]
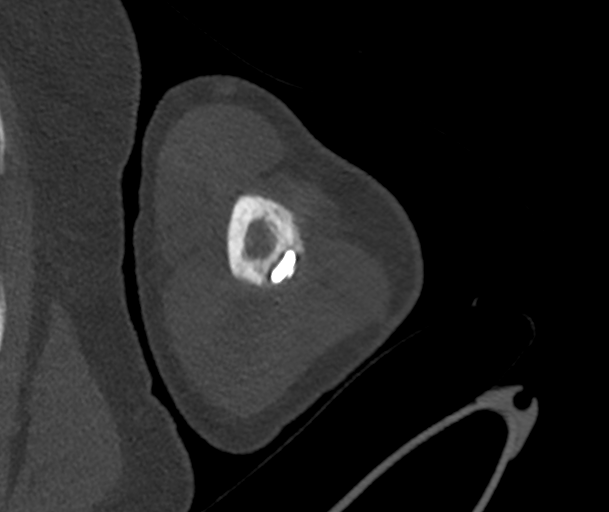

[Series 9: cor st · coronal · 0.39mm/px · 3 of 170 slices shown]
[im 34/170  bone]
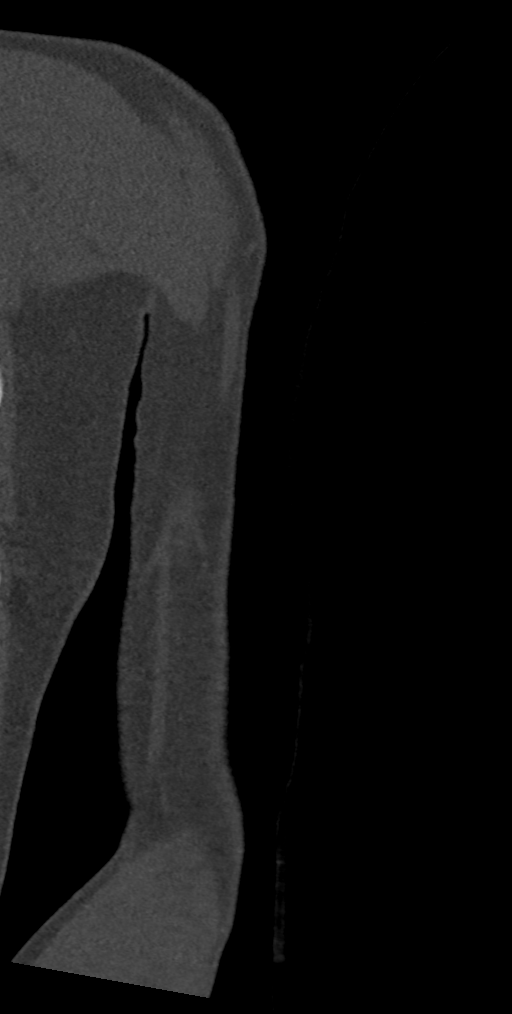
[im 68/170  bone]
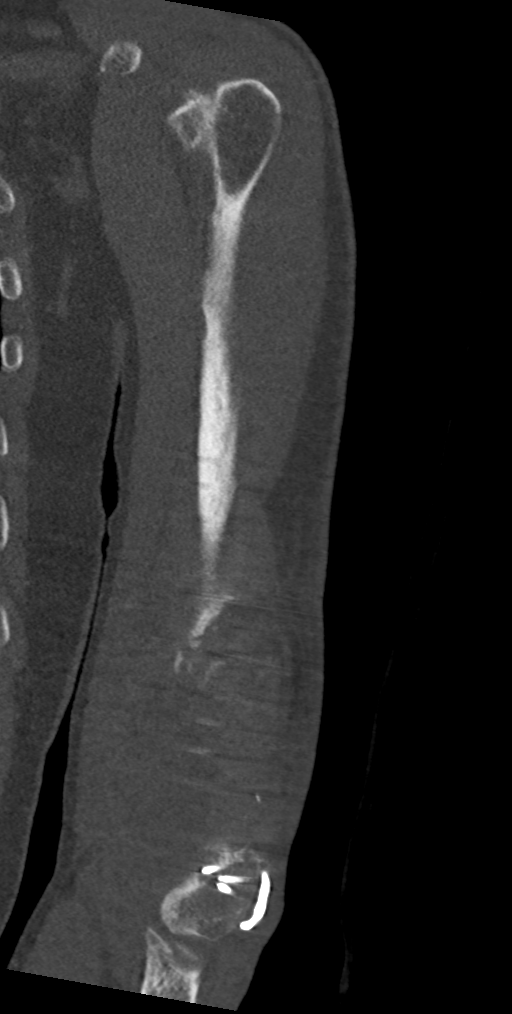
[im 102/170  bone]
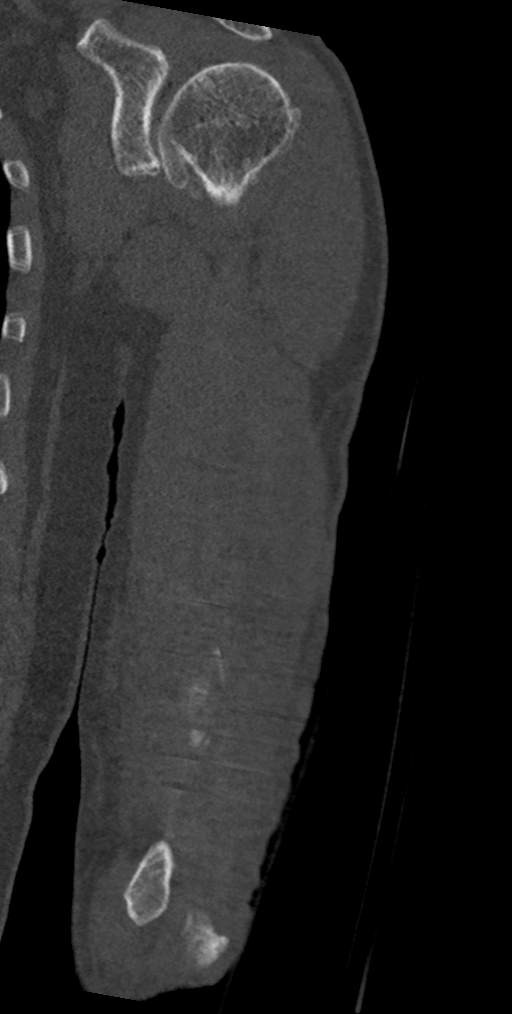

[Series 10: sag st · sagittal · 0.33mm/px · 5 of 200 slices shown]
[im 67/200  bone]
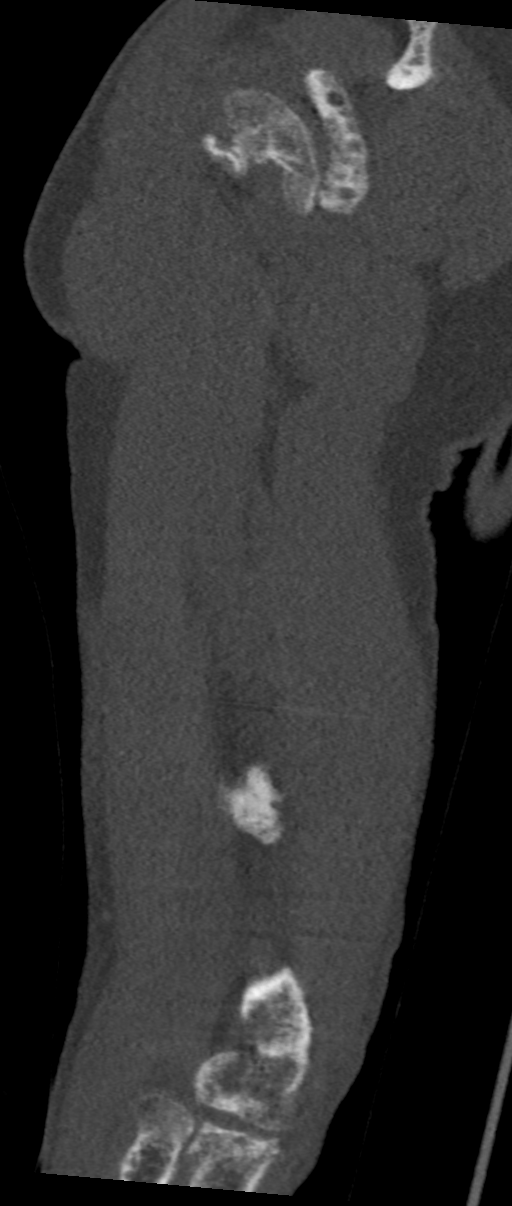
[im 83/200  bone]
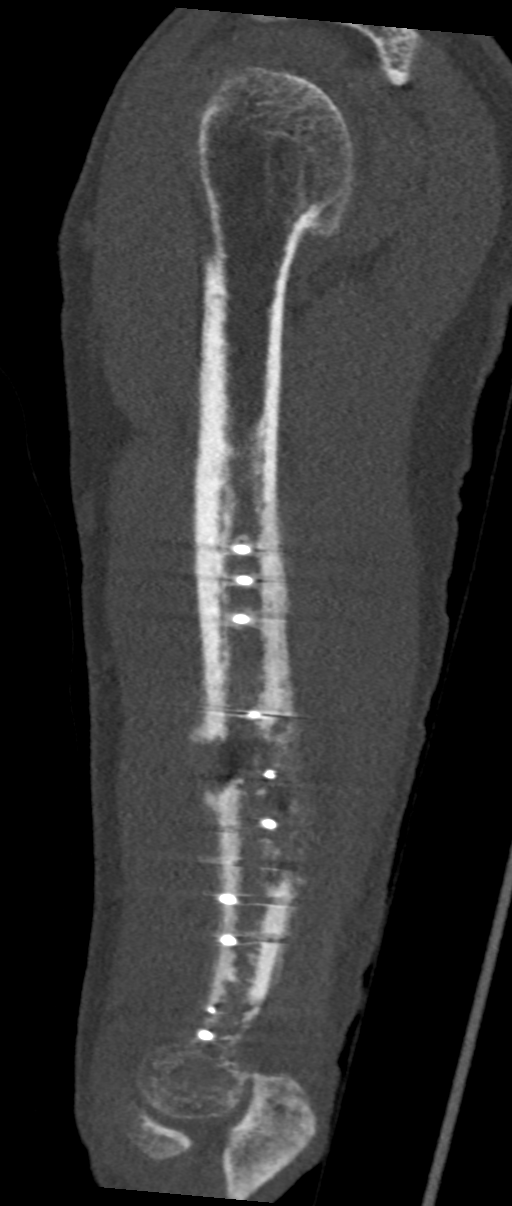
[im 100/200  bone]
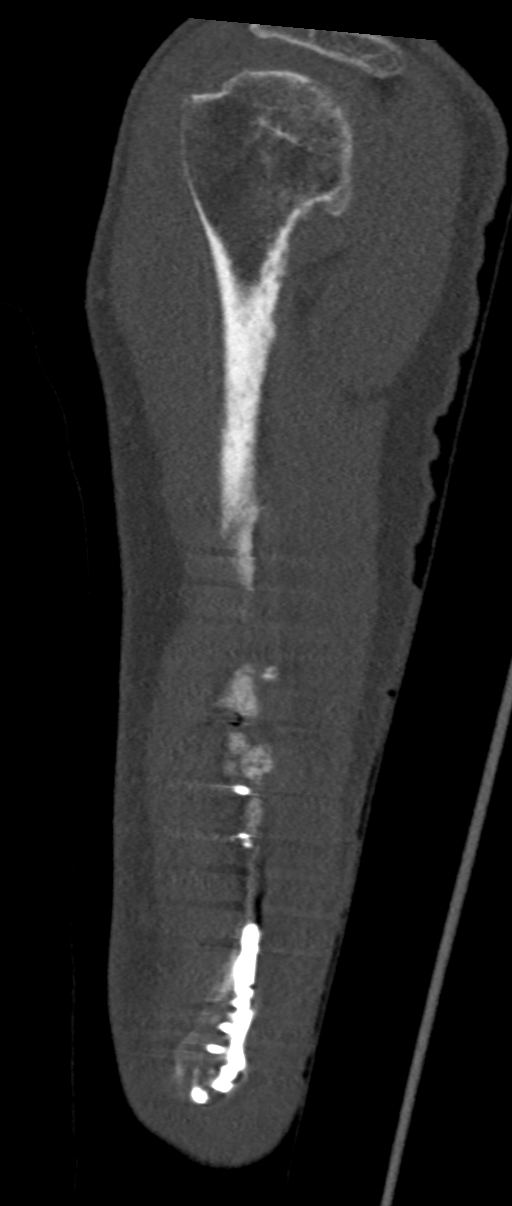
[im 117/200  bone]
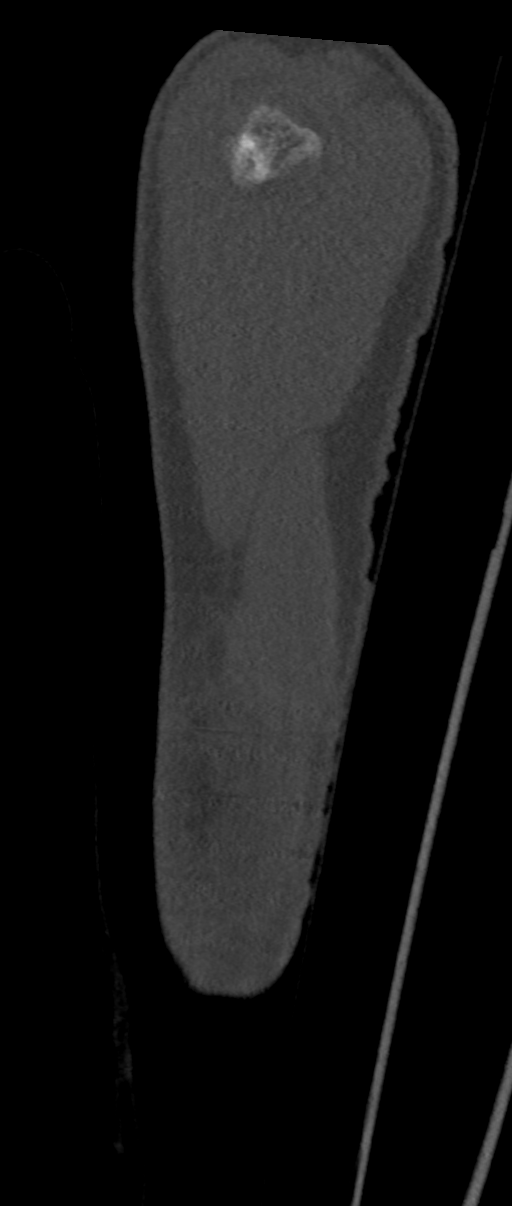
[im 133/200  bone]
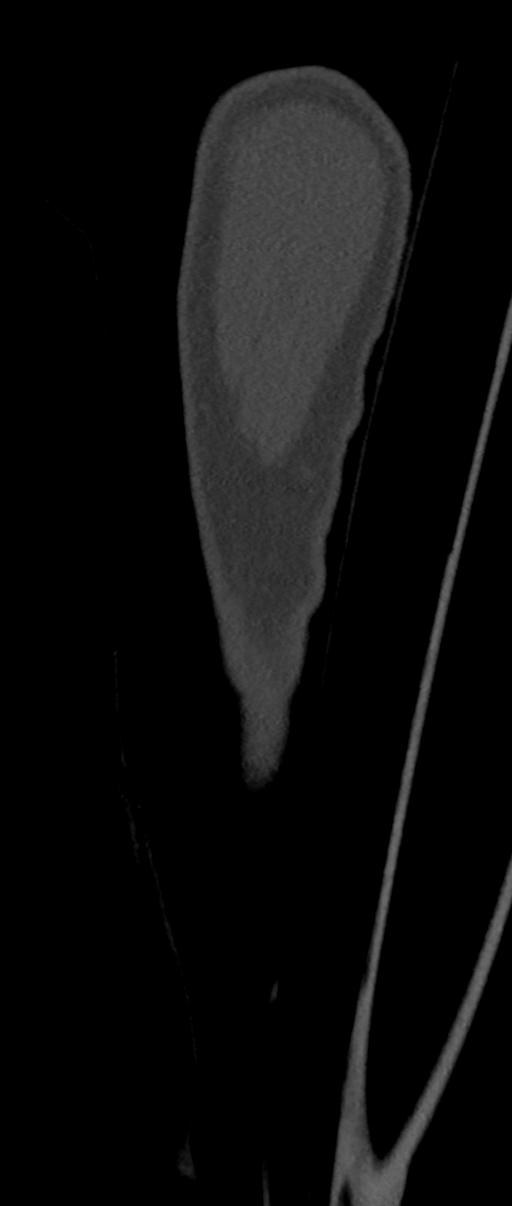

[9 of 36 positions shown; findings below may reference images not displayed]

FINDINGS: Bones/Joint/Cartilage

Status post posterior plate and screw fixation of the mid to distal
left humeral fracture. There is progressive lucency along the inner
margin of the plate, best appreciated on the coronal images (series
6, images 86-90). There is some increased lucency surrounding the
third most superior screw. Relatively unchanged lucency surrounding
the three screws traversing the fracture line.

There has been some progressive resorption along the fracture
margins but with relatively unchanged callus formation and
periosteal reaction. No bridging bone. No progressive bony
destruction.

Unchanged severe glenohumeral and mild elbow osteoarthritis. No
joint effusion.

Ligaments

Ligaments are suboptimally evaluated by CT.

Muscles and Tendons
Grossly intact. No intramuscular fluid collection or muscle atrophy.

Soft tissue
No fluid collection or subcutaneous emphysema.  No soft tissue mass.
IMPRESSION: 1. Status post left humerus ORIF with progressive lucency along the
inner margin of the plate, concerning for loosening. Chronic
infection could result in progressive loosening, but there is no
evidence of bone destruction to suggest osteomyelitis.
2. Progressive resorption along the fracture margins but without
significant bridging bone.
